# Patient Record
Sex: Female | Born: 1980 | Race: Black or African American | Hispanic: No | Marital: Married
Health system: Midwestern US, Community
[De-identification: ages and names within clinical notes are randomized; demographics above are authoritative.]

## PROBLEM LIST (undated history)

## (undated) ENCOUNTER — Inpatient Hospital Stay (HOSPITAL_COMMUNITY): Payer: Self-pay

## (undated) DIAGNOSIS — Z789 Other specified health status: Secondary | ICD-10-CM

## (undated) DIAGNOSIS — J45909 Unspecified asthma, uncomplicated: Secondary | ICD-10-CM

## (undated) DIAGNOSIS — E119 Type 2 diabetes mellitus without complications: Secondary | ICD-10-CM

## (undated) HISTORY — DX: Type 2 diabetes mellitus without complications: E11.9

## (undated) HISTORY — PX: NO PAST SURGERIES: SHX2092

---

## 2011-08-24 ENCOUNTER — Other Ambulatory Visit (HOSPITAL_COMMUNITY): Payer: Self-pay | Admitting: Family Medicine

## 2011-08-24 ENCOUNTER — Other Ambulatory Visit: Payer: Self-pay | Admitting: Family Medicine

## 2011-08-24 DIAGNOSIS — Z3682 Encounter for antenatal screening for nuchal translucency: Secondary | ICD-10-CM

## 2011-09-01 ENCOUNTER — Other Ambulatory Visit (HOSPITAL_COMMUNITY): Payer: Self-pay | Admitting: Nurse Practitioner

## 2011-09-01 DIAGNOSIS — Z0000 Encounter for general adult medical examination without abnormal findings: Secondary | ICD-10-CM

## 2011-09-03 ENCOUNTER — Encounter (HOSPITAL_COMMUNITY): Payer: Self-pay

## 2011-09-03 ENCOUNTER — Ambulatory Visit (HOSPITAL_COMMUNITY)
Admission: RE | Admit: 2011-09-03 | Discharge: 2011-09-03 | Disposition: A | Discharge status: Home / Self Care | Payer: Medicaid Other | Source: Ambulatory Visit | Attending: Family Medicine | Admitting: Family Medicine

## 2011-09-03 DIAGNOSIS — O351XX0 Maternal care for (suspected) chromosomal abnormality in fetus, not applicable or unspecified: Secondary | ICD-10-CM | POA: Insufficient documentation

## 2011-09-03 DIAGNOSIS — Z3689 Encounter for other specified antenatal screening: Secondary | ICD-10-CM | POA: Insufficient documentation

## 2011-09-03 DIAGNOSIS — Z3682 Encounter for antenatal screening for nuchal translucency: Secondary | ICD-10-CM

## 2011-09-03 DIAGNOSIS — O3510X0 Maternal care for (suspected) chromosomal abnormality in fetus, unspecified, not applicable or unspecified: Secondary | ICD-10-CM | POA: Insufficient documentation

## 2011-09-03 NOTE — Progress Notes (Signed)
Obstetric ultrasound for evaluation of nuchal translucency performed today.  See report in [facility].

## 2011-09-10 ENCOUNTER — Other Ambulatory Visit: Payer: Self-pay

## 2011-10-14 ENCOUNTER — Ambulatory Visit (HOSPITAL_COMMUNITY)
Admission: RE | Admit: 2011-10-14 | Discharge: 2011-10-14 | Disposition: A | Discharge status: Home / Self Care | Payer: Medicaid Other | Source: Ambulatory Visit | Attending: Nurse Practitioner | Admitting: Nurse Practitioner

## 2011-10-14 DIAGNOSIS — Z3682 Encounter for antenatal screening for nuchal translucency: Secondary | ICD-10-CM

## 2011-10-14 DIAGNOSIS — Z363 Encounter for antenatal screening for malformations: Secondary | ICD-10-CM | POA: Insufficient documentation

## 2011-10-14 DIAGNOSIS — O358XX0 Maternal care for other (suspected) fetal abnormality and damage, not applicable or unspecified: Secondary | ICD-10-CM | POA: Insufficient documentation

## 2011-10-14 DIAGNOSIS — Z1389 Encounter for screening for other disorder: Secondary | ICD-10-CM | POA: Insufficient documentation

## 2011-11-09 NOTE — L&D Delivery Note (Signed)
Delivery Note At 4:21 AM a viable female was delivered via Vaginal, Spontaneous Delivery (Presentation: ; Occiput Anterior).  APGAR: 9, 9; weight .   Placenta status: Intact, Spontaneous.  Cord: 3 vessels with the following complications: None.    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 Blood Loss (mL): 300  Mom to postpartum.  Baby to nursery-stable.  Vanessa Marks [name] 03/14/2012, 5:31 AM

## 2012-01-27 ENCOUNTER — Other Ambulatory Visit (HOSPITAL_COMMUNITY): Payer: Self-pay | Admitting: Physician Assistant

## 2012-01-27 ENCOUNTER — Other Ambulatory Visit: Payer: Self-pay | Admitting: Nurse Practitioner

## 2012-01-27 DIAGNOSIS — R319 Hematuria, unspecified: Secondary | ICD-10-CM

## 2012-02-03 ENCOUNTER — Ambulatory Visit (HOSPITAL_COMMUNITY)
Admission: RE | Admit: 2012-02-03 | Discharge: 2012-02-03 | Disposition: A | Discharge status: Home / Self Care | Payer: Medicaid Other | Source: Ambulatory Visit | Attending: Physician Assistant | Admitting: Physician Assistant

## 2012-02-03 DIAGNOSIS — N2889 Other specified disorders of kidney and ureter: Secondary | ICD-10-CM | POA: Insufficient documentation

## 2012-02-03 DIAGNOSIS — O99891 Other specified diseases and conditions complicating pregnancy: Secondary | ICD-10-CM | POA: Insufficient documentation

## 2012-02-03 DIAGNOSIS — N949 Unspecified condition associated with female genital organs and menstrual cycle: Secondary | ICD-10-CM | POA: Insufficient documentation

## 2012-02-03 DIAGNOSIS — R319 Hematuria, unspecified: Secondary | ICD-10-CM | POA: Insufficient documentation

## 2012-02-22 ENCOUNTER — Encounter (HOSPITAL_COMMUNITY): Payer: Self-pay | Admitting: *Deleted

## 2012-02-22 ENCOUNTER — Inpatient Hospital Stay (HOSPITAL_COMMUNITY)
Admission: AD | Admit: 2012-02-22 | Discharge: 2012-02-22 | Disposition: A | Discharge status: Home / Self Care | Payer: Medicaid Other | Source: Ambulatory Visit | Attending: Obstetrics & Gynecology | Admitting: Obstetrics & Gynecology

## 2012-02-22 DIAGNOSIS — O99891 Other specified diseases and conditions complicating pregnancy: Secondary | ICD-10-CM | POA: Insufficient documentation

## 2012-02-22 DIAGNOSIS — R059 Cough, unspecified: Secondary | ICD-10-CM | POA: Insufficient documentation

## 2012-02-22 DIAGNOSIS — R05 Cough: Secondary | ICD-10-CM | POA: Insufficient documentation

## 2012-02-22 DIAGNOSIS — J45909 Unspecified asthma, uncomplicated: Secondary | ICD-10-CM

## 2012-02-22 HISTORY — DX: Other specified health status: Z78.9

## 2012-02-22 MED ORDER — ALBUTEROL SULFATE HFA 108 (90 BASE) MCG/ACT IN AERS: 2.0000 | INHALATION_SPRAY | Freq: Four times a day (QID) | RESPIRATORY_TRACT | Status: DC | PRN

## 2012-02-22 MED ORDER — ALBUTEROL SULFATE (5 MG/ML) 0.5% IN NEBU
2.5000 mg | INHALATION_SOLUTION | Freq: Once | RESPIRATORY_TRACT | Status: AC
  Administered 2012-02-22: 2.5 mg via RESPIRATORY_TRACT

## 2012-02-22 MED ORDER — ALBUTEROL SULFATE (5 MG/ML) 0.5% IN NEBU
INHALATION_SOLUTION | RESPIRATORY_TRACT | Status: AC
  Administered 2012-02-22: 2.5 mg via RESPIRATORY_TRACT
  Filled 2012-02-22: qty 0.5

## 2012-02-22 NOTE — MAU Note (Signed)
D. Poe, CNM at bedside.  Assessment done and poc discussed with pt.  

## 2012-02-22 NOTE — MAU Provider Note (Signed)
Chief Complaint:  Cough and Shortness of Breath   None     HPI  Vanessa Marks is  31 y.o. G2P1001 at [redacted]w[redacted]d presents with 1 hr hx SOB and dry cough. No fever, UR congestion, palpitations or chest pain. No prior episodes or known allergen exposures.  Denies contractions, leakage of fluid or vaginal bleeding. Good fetal movement.   Pregnancy Course: uncomplicated with care at [facility]  Past Medical History: Past Medical History  Diagnosis Date  . No pertinent past medical history     Past Surgical History: Past Surgical History  Procedure Date  . No past surgeries     Family History: History reviewed. No pertinent family history.  Social History: History  Substance Use Topics  . Smoking status: Never Smoker   . Smokeless tobacco: Not on file  . Alcohol Use: No    Allergies: No Known Allergies  Meds:  Prescriptions prior to admission  Medication Sig Dispense Refill  . PRENATAL VITAMINS PO Take by mouth.               Physical Exam  Blood pressure 155/78, pulse 75, temperature 99.6 F (37.6 C), temperature source Oral, resp. rate 22, height 5' 4.5" (1.638 m), weight 80.74 kg (178 lb), last menstrual period 06/11/2011, SpO2 100.00%. GENERAL: Well-developed, well-nourished female in no acute distress.  COR: RRR w/o murmur LUNGS: scattered wheezes with good air movement ABDOMEN: Soft, nontender, nondistended, gravid.  EXTREMITIES: Nontender, no edema, 2+ distal pulses.    FHT:  Baseline 135 , moderate variability, accelerations present, no decelerations Contractions: none  MAU Course: Sx resolved after 1 albuterol nebulizer tx  Assessment: G2 P1 at 36.4 wk Reactive airway dz Cat 1 FHR  Plan: Rx Albuterol inhaler F/U at [facility] in 2 days Avoid allergens         Vanessa Marks 4/16/201312:46 AM

## 2012-02-22 NOTE — MAU Note (Signed)
Bernita Buffy, CNM at bedside assessing pt after breathing treatment.  Pt feeling much better at this time.

## 2012-02-22 NOTE — MAU Note (Signed)
Pt reports a complaint of shortness of breath and cough x 1 hour only. States no history of asthma

## 2012-02-22 NOTE — MAU Note (Signed)
RT finished with breathing treatment at this time.

## 2012-02-29 ENCOUNTER — Other Ambulatory Visit: Payer: Self-pay | Admitting: Family Medicine

## 2012-03-12 ENCOUNTER — Encounter (HOSPITAL_COMMUNITY): Payer: Self-pay | Admitting: *Deleted

## 2012-03-12 ENCOUNTER — Inpatient Hospital Stay (HOSPITAL_COMMUNITY)
Admission: AD | Admit: 2012-03-12 | Discharge: 2012-03-12 | Disposition: A | Discharge status: Home / Self Care | Payer: Medicaid Other | Source: Ambulatory Visit | Attending: Obstetrics & Gynecology | Admitting: Obstetrics & Gynecology

## 2012-03-12 DIAGNOSIS — O479 False labor, unspecified: Secondary | ICD-10-CM

## 2012-03-12 NOTE — MAU Note (Signed)
Pt in for labor eval, reports ucs since 10 am.  Denies any bleeding or lof.   + FM.

## 2012-03-12 NOTE — MAU Provider Note (Signed)
Vanessa Marks is a 31 y.o. female presenting for eval of contractions. Denies leak or bldg, no s/s preeclampsia, no dysuria. History OB History    Grav Para Term Preterm Abortions TAB SAB Ect Mult Living   2 1 1  0 0 0 0 0 0 1     Past Medical History  Diagnosis Date  . No pertinent past medical history    Past Surgical History  Procedure Date  . No past surgeries    Family History: family history is not on file. Social History:  reports that she has never smoked. She does not have any smokeless tobacco history on file. She reports that she does not drink alcohol or use illicit drugs.  ROS  Dilation: 4 Effacement (%): 70 Station: -2 Exam by:: KIM Trevaris Pennella, CNM Blood pressure 135/70, pulse 82, temperature 98.2 F (36.8 C), temperature source Oral, resp. rate 20, height 5\' 4"  (1.626 m), weight 74.39 kg (164 lb), last menstrual period 06/11/2011, SpO2 100.00%. Maternal Exam:  Uterine Assessment: Rare, mild; no pattern  Cervix: Remained essentially unchanged for 3 exams: 4/70/-2/post  Fetal Exam Fetal Monitor Review: Baseline rate: 140.  Variability: moderate (6-25 bpm).   Pattern: accelerations present.   Single variable after one ctx     Physical Exam  Constitutional: She is oriented to person, place, and time. She appears well-developed and well-nourished.  HENT:  Head: Normocephalic.  Cardiovascular: Normal rate.   Respiratory: Effort normal.  Musculoskeletal: Normal range of motion.  Neurological: She is alert and oriented to person, place, and time.  Skin: Skin is warm and dry.  Psychiatric: She has a normal mood and affect. Her behavior is normal. Thought content normal.    Prenatal labs: ABO, Rh:   Antibody:   Rubella:   RPR:    HBsAg:    HIV:    GBS:     Assessment/Plan: IUP at term False labor  D/C home with labor precautions F/U at [facility] as sched or sooner with labor/rom   Daimion Adamcik, [facility] 03/12/2012, 11:05 PM

## 2012-03-12 NOTE — Discharge Instructions (Signed)

## 2012-03-14 ENCOUNTER — Inpatient Hospital Stay (HOSPITAL_COMMUNITY)
Admission: AD | Admit: 2012-03-14 | Discharge: 2012-03-16 | DRG: 775 | Disposition: A | Discharge status: Home / Self Care | Payer: Medicaid Other | Source: Ambulatory Visit | Attending: Obstetrics & Gynecology | Admitting: Obstetrics & Gynecology

## 2012-03-14 ENCOUNTER — Encounter (HOSPITAL_COMMUNITY): Payer: Self-pay | Admitting: *Deleted

## 2012-03-14 DIAGNOSIS — N90810 Female genital mutilation status, unspecified: Secondary | ICD-10-CM | POA: Diagnosis present

## 2012-03-14 DIAGNOSIS — O99892 Other specified diseases and conditions complicating childbirth: Secondary | ICD-10-CM | POA: Diagnosis present

## 2012-03-14 DIAGNOSIS — O9989 Other specified diseases and conditions complicating pregnancy, childbirth and the puerperium: Secondary | ICD-10-CM

## 2012-03-14 DIAGNOSIS — Z22330 Carrier of Group B streptococcus: Secondary | ICD-10-CM

## 2012-03-14 LAB — CBC
Hemoglobin: 12.8 g/dL (ref 12.0–15.0)
MCHC: 32.7 g/dL (ref 30.0–36.0)
Platelets: 151 10*3/uL (ref 150–400)
RBC: 4.77 MIL/uL (ref 3.87–5.11)

## 2012-03-14 LAB — OB RESULTS CONSOLE RUBELLA ANTIBODY, IGM: Rubella: IMMUNE

## 2012-03-14 LAB — OB RESULTS CONSOLE HIV ANTIBODY (ROUTINE TESTING): HIV: NONREACTIVE

## 2012-03-14 LAB — OB RESULTS CONSOLE GC/CHLAMYDIA: Chlamydia: NEGATIVE

## 2012-03-14 LAB — OB RESULTS CONSOLE GBS: GBS: POSITIVE

## 2012-03-14 LAB — OB RESULTS CONSOLE HEPATITIS B SURFACE ANTIGEN: Hepatitis B Surface Ag: NEGATIVE

## 2012-03-14 LAB — OB RESULTS CONSOLE RPR: RPR: NONREACTIVE

## 2012-03-14 LAB — RPR: RPR Ser Ql: NONREACTIVE

## 2012-03-14 MED ORDER — ONDANSETRON HCL 4 MG/2ML IJ SOLN: 4.0000 mg | Freq: Four times a day (QID) | INTRAMUSCULAR | Status: DC | PRN

## 2012-03-14 MED ORDER — OXYCODONE-ACETAMINOPHEN 5-325 MG PO TABS: 1.0000 | ORAL_TABLET | ORAL | Status: DC | PRN

## 2012-03-14 MED ORDER — TETANUS-DIPHTH-ACELL PERTUSSIS 5-2.5-18.5 LF-MCG/0.5 IM SUSP: 0.5000 mL | Freq: Once | INTRAMUSCULAR | Status: DC

## 2012-03-14 MED ORDER — ONDANSETRON HCL 4 MG/2ML IJ SOLN: 4.0000 mg | INTRAMUSCULAR | Status: DC | PRN

## 2012-03-14 MED ORDER — ONDANSETRON HCL 4 MG PO TABS: 4.0000 mg | ORAL_TABLET | ORAL | Status: DC | PRN

## 2012-03-14 MED ORDER — WITCH HAZEL-GLYCERIN EX PADS: 1.0000 "application " | MEDICATED_PAD | CUTANEOUS | Status: DC | PRN

## 2012-03-14 MED ORDER — DIBUCAINE 1 % RE OINT: 1.0000 "application " | TOPICAL_OINTMENT | RECTAL | Status: DC | PRN

## 2012-03-14 MED ORDER — LACTATED RINGERS IV SOLN: 500.0000 mL | INTRAVENOUS | Status: DC | PRN

## 2012-03-14 MED ORDER — CITRIC ACID-SODIUM CITRATE 334-500 MG/5ML PO SOLN: 30.0000 mL | ORAL | Status: DC | PRN

## 2012-03-14 MED ORDER — PRENATAL MULTIVITAMIN CH
1.0000 | ORAL_TABLET | Freq: Every day | ORAL | Status: DC
  Administered 2012-03-14 – 2012-03-16 (×3): 1 via ORAL
  Filled 2012-03-14 (×3): qty 1

## 2012-03-14 MED ORDER — BENZOCAINE-MENTHOL 20-0.5 % EX AERO
1.0000 "application " | INHALATION_SPRAY | CUTANEOUS | Status: DC | PRN
  Filled 2012-03-14: qty 56

## 2012-03-14 MED ORDER — SODIUM CHLORIDE 0.9 % IV SOLN
2.0000 g | Freq: Once | INTRAVENOUS | Status: AC
  Administered 2012-03-14: 2 g via INTRAVENOUS
  Filled 2012-03-14: qty 2000

## 2012-03-14 MED ORDER — CEFAZOLIN SODIUM-DEXTROSE 2-3 GM-% IV SOLR: 2.0000 g | Freq: Once | INTRAVENOUS | Status: DC

## 2012-03-14 MED ORDER — SIMETHICONE 80 MG PO CHEW: 80.0000 mg | CHEWABLE_TABLET | ORAL | Status: DC | PRN

## 2012-03-14 MED ORDER — FLEET ENEMA 7-19 GM/118ML RE ENEM: 1.0000 | ENEMA | RECTAL | Status: DC | PRN

## 2012-03-14 MED ORDER — LANOLIN HYDROUS EX OINT: TOPICAL_OINTMENT | CUTANEOUS | Status: DC | PRN

## 2012-03-14 MED ORDER — ZOLPIDEM TARTRATE 5 MG PO TABS: 5.0000 mg | ORAL_TABLET | Freq: Every evening | ORAL | Status: DC | PRN

## 2012-03-14 MED ORDER — SENNOSIDES-DOCUSATE SODIUM 8.6-50 MG PO TABS
2.0000 | ORAL_TABLET | Freq: Every day | ORAL | Status: DC
  Administered 2012-03-14 – 2012-03-15 (×2): 2 via ORAL

## 2012-03-14 MED ORDER — LACTATED RINGERS IV SOLN: INTRAVENOUS | Status: DC

## 2012-03-14 MED ORDER — DIPHENHYDRAMINE HCL 25 MG PO CAPS: 25.0000 mg | ORAL_CAPSULE | Freq: Four times a day (QID) | ORAL | Status: DC | PRN

## 2012-03-14 MED ORDER — OXYTOCIN BOLUS FROM INFUSION
500.0000 mL | Freq: Once | INTRAVENOUS | Status: DC
  Filled 2012-03-14: qty 1000
  Filled 2012-03-14: qty 500

## 2012-03-14 MED ORDER — ACETAMINOPHEN 325 MG PO TABS: 650.0000 mg | ORAL_TABLET | ORAL | Status: DC | PRN

## 2012-03-14 MED ORDER — IBUPROFEN 600 MG PO TABS: 600.0000 mg | ORAL_TABLET | Freq: Four times a day (QID) | ORAL | Status: DC | PRN

## 2012-03-14 MED ORDER — IBUPROFEN 600 MG PO TABS
600.0000 mg | ORAL_TABLET | Freq: Four times a day (QID) | ORAL | Status: DC
  Administered 2012-03-14 – 2012-03-16 (×8): 600 mg via ORAL
  Filled 2012-03-14 (×8): qty 1

## 2012-03-14 MED ORDER — OXYTOCIN 20 UNITS IN LACTATED RINGERS INFUSION - SIMPLE
125.0000 mL/h | Freq: Once | INTRAVENOUS | Status: AC
  Administered 2012-03-14: 999 mL/h via INTRAVENOUS

## 2012-03-14 MED ORDER — LIDOCAINE HCL (PF) 1 % IJ SOLN
30.0000 mL | INTRAMUSCULAR | Status: DC | PRN
  Administered 2012-03-14: 30 mL via SUBCUTANEOUS
  Filled 2012-03-14: qty 30

## 2012-03-14 NOTE — Progress Notes (Signed)
Notified of pt presenting for labor check.  Notified of VE and ctx pattern.  Will admit to birthing suites.

## 2012-03-14 NOTE — H&P (Signed)
Vanessa Marks is a 31 y.o. female presenting for SOL. Maternal Medical History:  Reason for admission: Reason for admission: contractions.  Contractions: Onset was 3-5 hours ago.    Fetal activity: Perceived fetal activity is normal.     Mother rushed down from MAU at 7cm.  Patient delivered quickly after arrival.  Please see delivery note for details.  OB History    Grav Para Term Preterm Abortions TAB SAB Ect Mult Living   2 1 1  0 0 0 0 0 0 1     Past Medical History  Diagnosis Date  . No pertinent past medical history    Past Surgical History  Procedure Date  . No past surgeries    Family History: family history is not on file. Social History:  reports that she has never smoked. She does not have any smokeless tobacco history on file. She reports that she does not drink alcohol or use illicit drugs.  ROS    Last menstrual period 06/11/2011. Maternal Exam:  Uterine Assessment: Contraction strength is moderate.    Physical Exam  Constitutional: She is oriented to person, place, and time. She appears well-developed and well-nourished.  GI: Soft. Bowel sounds are normal. She exhibits no distension and no mass. There is no tenderness. There is no rebound and no guarding.  Neurological: She is alert and oriented to person, place, and time.  Skin: Skin is warm and dry.  Psychiatric: She has a normal mood and affect. Her behavior is normal. Judgment and thought content normal.    Prenatal labs: ABO, Rh:  A+ Antibody:  negative Rubella:  immune RPR:   non-reactive HBsAg:   negative HIV:   nonreactive GBS:   positive  Assessment/Plan: 1  IUP 39 weeks 4 days 2.  Active labor 3.  GBS positive 4.  Female circumcision  Admit to L&D. Ampicillin was given and completed 2 minutes prior to delivery.  Breast and bottle feeding.   Vanessa Marks [name] 03/14/2012, 4:11 AM

## 2012-03-14 NOTE — MAU Note (Signed)
Pt brought back from lobby for discomfort.  Denies and bleeding.

## 2012-03-14 NOTE — Progress Notes (Signed)
UR chart review completed.  

## 2012-03-15 NOTE — Progress Notes (Signed)
Post Partum Day #1 Subjective: no complaints, up ad lib, voiding, tolerating PO and + flatus Patient is breastfeeding and supplementing with bottle as needed.   Denies contraception.  Pain well tolerated.  Small amount of bloody discharge.     Objective: Blood pressure 144/78, pulse 70, temperature 98 F (36.7 C), temperature source Oral, resp. rate 18, height 5\' 4"  (1.626 m), weight 74.39 kg (164 lb), last menstrual period 06/11/2011, SpO2 97.00%, unknown if currently breastfeeding. 2 previous BPs 133/82, 126/78.   Physical Exam:  General: alert, cooperative and no distress Heart: RRR, normal S1 and S2, no MRG. Lungs:  Clear to auscultation across all lung fields. No wheezing, rales, or rhonchi.     Lochia: appropriate Uterine Fundus: firm, below umbilicus  Lower extremities: 2+ DP pulses, 2+ PT pulses.  No edema.  DVT Evaluation: No evidence of DVT seen on physical exam. No calf tenderness with palpation.    Basename 03/14/12 0405  HGB 12.8  HCT 39.2    Assessment/Plan: Plan for discharge tomorrow   LOS: 1 day   Bedelia Person, PA-S 03/15/2012, 7:42 AM

## 2012-03-15 NOTE — Progress Notes (Signed)
I have seen and examined this patient and I agree with the above. Cam Hai 7:59 AM 03/15/2012

## 2012-03-16 MED ORDER — IBUPROFEN 600 MG PO TABS: 600.0000 mg | ORAL_TABLET | Freq: Four times a day (QID) | ORAL | Status: AC

## 2012-03-16 NOTE — Discharge Summary (Signed)
Obstetric Discharge Summary Reason for Admission: onset of labor Prenatal Procedures: none Intrapartum Procedures: spontaneous vaginal delivery Postpartum Procedures: none Complications-Operative and Postpartum: 1 degree perineal laceration Hemoglobin  Date Value Range Status  03/14/2012 12.8  12.0-15.0 (g/dL) Final     HCT  Date Value Range Status  03/14/2012 39.2  36.0-46.0 (%) Final    Physical Exam:  General: alert, cooperative and no distress Lochia: appropriate Uterine Fundus: firm DVT Evaluation: No evidence of DVT seen on physical exam. Negative Homan's sign. No cords or calf tenderness. No significant calf/ankle edema.  Discharge Diagnoses: Term Pregnancy-delivered  Discharge Information: Date: 03/16/2012 Activity: pelvic rest Diet: routine Medications: PNV and Ibuprofen Condition: stable Instructions: refer to practice specific booklet Discharge to: home Follow-up Information    Follow up with [facility] [place] in 6 weeks.   Contact information:   [address] [employer] [place] [address]          Newborn Data: Live born female  Birth Weight: 6 lb 13.5 oz (3105 g) APGAR: 9, 9  Home with mother.  Marigene Erler [name] 03/16/2012, 11:14 AM

## 2012-03-16 NOTE — Discharge Instructions (Signed)
Vaginal Delivery Care After  Change your pad on each trip to the bathroom.   Wipe gently with toilet paper during your hospital stay. Always wipe from front to back. A spray bottle with warm tap water could also be used or a towelette if available.   Place your soiled pad and toilet paper in a bathroom wastebasket with a plastic bag liner.   During your hospital stay, save any clots. If you pass a clot while on the toilet, do not flush it. Also, if your vaginal flow seems excessive to you, notify nursing personnel.   The first time you get out of bed after delivery, wait for assistance from a nurse. Do not get up alone at any time if you feel weak or dizzy.   Bend and extend your ankles forcefully so that you feel the calves of your legs get hard. Do this 6 times every hour when you are in bed and awake.   Do not sit with one foot under you, dangle your legs over the edge of the bed, or maintain a position that hinders the circulation in your legs.   Many women experience after pains for 2 to 3 days after delivery. These after pains are mild uterine contractions. Ask the nurse for a pain medication if you need something for this. Sometimes breastfeeding stimulates after pains; if you find this to be true, ask for the medication  -  hour before the next feeding.   For you and your infant's protection, do not go beyond the door(s) of the obstetric unit. Do not carry your baby in your arms in the hallway. When taking your baby to and from your room, put your baby in the bassinet and push the bassinet.   Mothers may have their babies in their room as much as they desire.  Document Released: 10/22/2000 Document Revised: 10/14/2011 Document Reviewed: 09/22/2007 Exit[employer] Patient Information 2012 [employer]. 

## 2012-04-26 ENCOUNTER — Other Ambulatory Visit: Payer: Self-pay | Admitting: Family Medicine

## 2012-05-29 ENCOUNTER — Ambulatory Visit (INDEPENDENT_AMBULATORY_CARE_PROVIDER_SITE_OTHER): Payer: Medicaid Other | Admitting: Obstetrics & Gynecology

## 2012-05-29 ENCOUNTER — Encounter: Payer: Self-pay | Admitting: Obstetrics & Gynecology

## 2012-05-29 VITALS — BP 130/86 | HR 78 | Ht 65.0 in | Wt 157.0 lb

## 2012-05-29 DIAGNOSIS — Z01812 Encounter for preprocedural laboratory examination: Secondary | ICD-10-CM

## 2012-05-29 DIAGNOSIS — Z30017 Encounter for initial prescription of implantable subdermal contraceptive: Secondary | ICD-10-CM

## 2012-05-29 NOTE — Progress Notes (Signed)
  Subjective:    Patient ID: Brain Hilts, female    DOB: 06/04/1981, 31 y.o.   MRN: [id number]  HPI  She is here 2 months post partum to have a Nexplanon placed. She has not had sex since the delivery. She understands that irregular bleeding is sometimes a side effect of Nexplanon.   Review of Systems Pap normal 10/12    Objective:   Physical Exam  UPT negative, consent signed, time out done Her left arm was prepped with betadine and infiltrated with 3 cc of 1% lidocaine. A Nexplanon was easily placed about 8 cm up from her elbow. She tolerated the procedure well. A small steristrip was placed and her arm was bandaged.      Assessment & Plan:  Contraception- Nexplanon I have recommended condoms for a month. RTC 11/13 for annual/prn sooner.

## 2012-05-29 NOTE — Progress Notes (Signed)
Patient is here as a referral from the health department for nexplanon.

## 2014-09-09 ENCOUNTER — Encounter: Payer: Self-pay | Admitting: Obstetrics & Gynecology

## 2016-05-25 ENCOUNTER — Encounter (HOSPITAL_COMMUNITY): Payer: Self-pay | Admitting: *Deleted

## 2016-05-25 ENCOUNTER — Inpatient Hospital Stay (HOSPITAL_COMMUNITY): Payer: Medicaid Other

## 2016-05-25 ENCOUNTER — Inpatient Hospital Stay (HOSPITAL_COMMUNITY)
Admission: AD | Admit: 2016-05-25 | Discharge: 2016-05-26 | Disposition: A | Discharge status: Home / Self Care | Payer: Medicaid Other | Source: Ambulatory Visit | Attending: Obstetrics & Gynecology | Admitting: Obstetrics & Gynecology

## 2016-05-25 DIAGNOSIS — M545 Low back pain: Secondary | ICD-10-CM | POA: Diagnosis not present

## 2016-05-25 DIAGNOSIS — Z79899 Other long term (current) drug therapy: Secondary | ICD-10-CM | POA: Diagnosis not present

## 2016-05-25 DIAGNOSIS — O209 Hemorrhage in early pregnancy, unspecified: Secondary | ICD-10-CM | POA: Diagnosis present

## 2016-05-25 DIAGNOSIS — O3680X0 Pregnancy with inconclusive fetal viability, not applicable or unspecified: Secondary | ICD-10-CM

## 2016-05-25 LAB — URINALYSIS, ROUTINE W REFLEX MICROSCOPIC
Bilirubin Urine: NEGATIVE
GLUCOSE, UA: NEGATIVE mg/dL
Ketones, ur: NEGATIVE mg/dL
NITRITE: NEGATIVE
PH: 7 (ref 5.0–8.0)
PROTEIN: NEGATIVE mg/dL
Specific Gravity, Urine: 1.015 (ref 1.005–1.030)

## 2016-05-25 LAB — URINE MICROSCOPIC-ADD ON

## 2016-05-25 LAB — WET PREP, GENITAL
CLUE CELLS WET PREP: NONE SEEN
SPERM: NONE SEEN
TRICH WET PREP: NONE SEEN
YEAST WET PREP: NONE SEEN

## 2016-05-25 LAB — CBC WITH DIFFERENTIAL/PLATELET
BASOS PCT: 0 %
Basophils Absolute: 0 10*3/uL (ref 0.0–0.1)
Eosinophils Absolute: 0.3 10*3/uL (ref 0.0–0.7)
Eosinophils Relative: 4 %
HEMATOCRIT: 33.3 % — AB (ref 36.0–46.0)
HEMOGLOBIN: 10.9 g/dL — AB (ref 12.0–15.0)
Lymphocytes Relative: 40 %
Lymphs Abs: 3.1 10*3/uL (ref 0.7–4.0)
MCH: 26.3 pg (ref 26.0–34.0)
MCHC: 32.7 g/dL (ref 30.0–36.0)
MCV: 80.2 fL (ref 78.0–100.0)
MONOS PCT: 4 %
Monocytes Absolute: 0.3 10*3/uL (ref 0.1–1.0)
NEUTROS ABS: 4.2 10*3/uL (ref 1.7–7.7)
NEUTROS PCT: 52 %
Platelets: 254 10*3/uL (ref 150–400)
RBC: 4.15 MIL/uL (ref 3.87–5.11)
RDW: 13.8 % (ref 11.5–15.5)
WBC: 7.9 10*3/uL (ref 4.0–10.5)

## 2016-05-25 LAB — POCT PREGNANCY, URINE: Preg Test, Ur: POSITIVE — AB

## 2016-05-25 NOTE — MAU Provider Note (Signed)
History     CSN: [id number]  Arrival date and time: 05/25/16 2220   First Provider Initiated Contact with Patient 05/25/16 2245      Chief Complaint  Patient presents with  . Possible Pregnancy  . Vaginal Bleeding   HPI Vanessa Marks is a 35 y.o. G3P2002 at [redacted]w[redacted]d who presents to MAU today with complaint of spotting with wiping since earlier tonight. The patient states +HPT and LMP of 04/16/16 although she states that LMP was not normal for her, it was late and had "white stuff" in the blood. She denies abdominal pain, but endorses mild intermittent low back pain. She denies vaginal discharge, UTI symptoms, fever, N/V/D or recent intercourse. She has not yet been seen for this pregnancy.   OB History    Gravida Para Term Preterm AB TAB SAB Ectopic Multiple Living   0 0 0 0 0 0 2      Past Medical History  Diagnosis Date  . No pertinent past medical history     Past Surgical History  Procedure Laterality Date  . No past surgeries      History reviewed. No pertinent family history.  Social History  Substance Use Topics  . Smoking status: Never Smoker   . Smokeless tobacco: None  . Alcohol Use: No    Allergies: No Known Allergies  Prescriptions prior to admission  Medication Sig Dispense Refill Last Dose  . Prenatal Vit-Fe Fumarate-FA (PRENATAL MULTIVITAMIN) TABS Take 1 tablet by mouth daily.   05/25/2016 at Unknown time  . albuterol (PROVENTIL HFA;VENTOLIN HFA) 108 (90 BASE) MCG/ACT inhaler Inhale 2 puffs into the lungs every 6 (six) hours as needed for wheezing. 1 Inhaler 2 Taking  . ibuprofen (ADVIL,MOTRIN) 600 MG tablet    Taking  . Prenatal Vit-DSS-Fe Fum-FA (SE-NATAL 19) 29-1 MG TABS TAKE 1 TABLET BY MOUTH DAILY 32 tablet 0 Taking    Review of Systems  Constitutional: Negative for fever and malaise/fatigue.  Gastrointestinal: Negative for nausea, vomiting, abdominal pain, diarrhea and constipation.  Genitourinary: Negative for dysuria, urgency and  frequency.       + spotting Neg - vaginal discharge   Physical Exam   Blood pressure 150/81, pulse 90, temperature 98.3 F (36.8 C), temperature source Oral, resp. rate 18, height  (1.702 m), weight 176 lb (79.833 kg), last menstrual period 04/16/2016, SpO2 100 %, currently breastfeeding.  Physical Exam  Nursing note and vitals reviewed. Constitutional: She is oriented to person, place, and time. She appears well-developed and well-nourished. No distress.  HENT:  Head: Normocephalic and atraumatic.  Cardiovascular: Normal rate.   Respiratory: Effort normal.  GI: Soft. She exhibits no distension and no mass. There is no tenderness. There is no rebound and no guarding.  Genitourinary: Uterus is not enlarged and not tender. Cervix exhibits friability (significant friability noted). Cervix exhibits no motion tenderness and no discharge. Right adnexum displays no mass and no tenderness. Left adnexum displays no mass and no tenderness. There is bleeding (scant ) in the vagina. No vaginal discharge found.  Neurological: She is alert and oriented to person, place, and time.  Skin: Skin is warm and dry. No erythema.  Psychiatric: She has a normal mood and affect.   Results for orders placed or performed during the hospital encounter of 05/25/16 (from the past 24 hour(s))  Urinalysis, Routine w reflex microscopic (not at [facility])     Status: Abnormal   Collection Time: 05/25/16 10:30 PM  Result Value Ref  Range   Color, Urine YELLOW YELLOW   APPearance CLEAR CLEAR   Specific Gravity, Urine 1.015 1.005 - 1.030   pH 7.0 5.0 - 8.0   Glucose, UA NEGATIVE NEGATIVE mg/dL   Hgb urine dipstick MODERATE (A) NEGATIVE   Bilirubin Urine NEGATIVE NEGATIVE   Ketones, ur NEGATIVE NEGATIVE mg/dL   Protein, ur NEGATIVE NEGATIVE mg/dL   Nitrite NEGATIVE NEGATIVE   Leukocytes, UA TRACE (A) NEGATIVE  Urine microscopic-add on     Status: Abnormal   Collection Time: 05/25/16 10:30 PM  Result Value Ref Range    Squamous Epithelial / LPF 0-5 (A) NONE SEEN   WBC, UA 0-5 0 - 5 WBC/hpf   RBC / HPF 0-5 0 - 5 RBC/hpf   Bacteria, UA RARE (A) NONE SEEN  Pregnancy, urine POC     Status: Abnormal   Collection Time: 05/25/16 10:53 PM  Result Value Ref Range   Preg Test, Ur POSITIVE (A) NEGATIVE  Wet prep, genital     Status: Abnormal   Collection Time: 05/25/16 11:01 PM  Result Value Ref Range   Yeast Wet Prep HPF POC NONE SEEN NONE SEEN   Trich, Wet Prep NONE SEEN NONE SEEN   Clue Cells Wet Prep HPF POC NONE SEEN NONE SEEN   WBC, Wet Prep HPF POC FEW (A) NONE SEEN   Sperm NONE SEEN   CBC with Differential/Platelet     Status: Abnormal   Collection Time: 05/25/16 11:13 PM  Result Value Ref Range   WBC 7.9 4.0 - 10.5 K/uL   RBC 4.15 3.87 - 5.11 MIL/uL   Hemoglobin 10.9 (L) 12.0 - 15.0 g/dL   HCT 16.133.3 (L) 09.636.0 - 04.546.0 %   MCV 80.2 78.0 - 100.0 fL   MCH 26.3 26.0 - 34.0 pg   MCHC 32.7 30.0 - 36.0 g/dL   RDW 40.913.8 81.111.5 - 91.415.5 %   Platelets 254 150 - 400 K/uL   Neutrophils Relative % 52 %   Neutro Abs 4.2 1.7 - 7.7 K/uL   Lymphocytes Relative 40 %   Lymphs Abs 3.1 0.7 - 4.0 K/uL   Monocytes Relative 4 %   Monocytes Absolute 0.3 0.1 - 1.0 K/uL   Eosinophils Relative 4 %   Eosinophils Absolute 0.3 0.0 - 0.7 K/uL   Basophils Relative 0 %   Basophils Absolute 0.0 0.0 - 0.1 K/uL  hCG, quantitative, pregnancy     Status: Abnormal   Collection Time: 05/25/16 11:13 PM  Result Value Ref Range   hCG, Beta Chain, Quant, S 403 (H) <5 mIU/mL   [place] Ob Comp Less 14 Wks  05/25/2016  CLINICAL DATA:  Vaginal bleeding and first-trimester pregnancy. Pending quantitative beta HCG EXAM: OBSTETRIC <14 WK US AND TRANSVAGINAL OB US TECHNIQUE: Both transabdominal and transvaginal ultrasound examinations were performed for complete evaluation of the gestation as well as the maternal uterus, adnexal regions, and pelvic cul-de-sac. Transvaginal technique was performed to assess early pregnancy. COMPARISON:  None. FINDINGS:  No intra or extrauterine gestational sac is identified. No adnexal mass or pelvic fluid. Symmetric normal appearance of the ovaries. IMPRESSION: Pregnancy of unknown location with normal pelvic ultrasound. Beta HCG is currently pending. Differential considerations include intrauterine gestation too early to be sonographically visualized, spontaneous abortion, or ectopic pregnancy. Consider follow-up ultrasound in 10 days and serial quantitative beta HCG follow-up. Electronically Signed   By: Marnee SpringJonathon  Watts M.D.   On: 05/25/2016 23:56   [place] Ob Transvaginal  05/25/2016  CLINICAL DATA:  Vaginal  bleeding and first-trimester pregnancy. Pending quantitative beta HCG EXAM: OBSTETRIC <14 WK [place] AND TRANSVAGINAL OB US TECHNIQUE: Both transabdominal and transvaginal ultrasound examinations were performed for complete evaluation of the gestation as well as the maternal uterus, adnexal regions, and pelvic cul-de-sac. Transvaginal technique was performed to assess early pregnancy. COMPARISON:  None. FINDINGS: No intra or extrauterine gestational sac is identified. No adnexal mass or pelvic fluid. Symmetric normal appearance of the ovaries. IMPRESSION: Pregnancy of unknown location with normal pelvic ultrasound. Beta HCG is currently pending. Differential considerations include intrauterine gestation too early to be sonographically visualized, spontaneous abortion, or ectopic pregnancy. Consider follow-up ultrasound in 10 days and serial quantitative beta HCG follow-up. Electronically Signed   By: Marnee Spring M.D.   On: 05/25/2016 23:56    MAU Course  Procedures None  MDM +UPT UA, wet prep, GC/chlamydia, CBC, quant hCG, HIV, RPR and [place] today to rule out ectopic pregnancy A+ blood type from previous visit in Epic  Assessment and Plan  A: Pregnancy of unknown location Vaginal bleeding in pregnancy  P: Discharge home Ectopic/bleeding precautions discussed Patient advised to follow-up with [facility] on Friday at  8:00 am for repeat labs  Patient may return to MAU as needed or if her condition were to change or worsen   Marny Lowenstein, PA-C  05/26/2016, 12:13 AM

## 2016-05-25 NOTE — MAU Note (Signed)
Pt c/o vaginal bleeding that started tonight around 830 pm when wiping. Some mild lower back cramping-rates 3/10. LMP: 04/16/2016.

## 2016-05-26 DIAGNOSIS — O209 Hemorrhage in early pregnancy, unspecified: Secondary | ICD-10-CM

## 2016-05-26 LAB — GC/CHLAMYDIA PROBE AMP (~~LOC~~) NOT AT ARMC
Chlamydia: NEGATIVE
NEISSERIA GONORRHEA: NEGATIVE

## 2016-05-26 LAB — HCG, QUANTITATIVE, PREGNANCY: hCG, Beta Chain, Quant, S: 403 m[IU]/mL — ABNORMAL HIGH (ref <5)

## 2016-05-26 NOTE — Discharge Instructions (Signed)

## 2016-05-27 LAB — RPR: RPR Ser Ql: NONREACTIVE

## 2016-05-27 LAB — HIV ANTIBODY (ROUTINE TESTING W REFLEX): HIV Screen 4th Generation wRfx: NONREACTIVE

## 2016-05-28 ENCOUNTER — Encounter (HOSPITAL_COMMUNITY): Payer: Self-pay

## 2016-05-28 ENCOUNTER — Other Ambulatory Visit: Payer: Medicaid Other

## 2016-05-28 ENCOUNTER — Inpatient Hospital Stay (HOSPITAL_COMMUNITY)
Admission: AD | Admit: 2016-05-28 | Discharge: 2016-05-28 | Disposition: A | Discharge status: Home / Self Care | Payer: Medicaid Other | Source: Ambulatory Visit | Attending: Family Medicine | Admitting: Family Medicine

## 2016-05-28 DIAGNOSIS — O0281 Inappropriate change in quantitative human chorionic gonadotropin (hCG) in early pregnancy: Secondary | ICD-10-CM

## 2016-05-28 DIAGNOSIS — Z3A00 Weeks of gestation of pregnancy not specified: Secondary | ICD-10-CM | POA: Diagnosis not present

## 2016-05-28 DIAGNOSIS — J45909 Unspecified asthma, uncomplicated: Secondary | ICD-10-CM | POA: Diagnosis not present

## 2016-05-28 DIAGNOSIS — O209 Hemorrhage in early pregnancy, unspecified: Secondary | ICD-10-CM | POA: Insufficient documentation

## 2016-05-28 DIAGNOSIS — O99519 Diseases of the respiratory system complicating pregnancy, unspecified trimester: Secondary | ICD-10-CM | POA: Insufficient documentation

## 2016-05-28 DIAGNOSIS — R109 Unspecified abdominal pain: Secondary | ICD-10-CM | POA: Diagnosis present

## 2016-05-28 DIAGNOSIS — N939 Abnormal uterine and vaginal bleeding, unspecified: Secondary | ICD-10-CM | POA: Diagnosis present

## 2016-05-28 HISTORY — DX: Unspecified asthma, uncomplicated: J45.909

## 2016-05-28 LAB — CBC
HCT: 33 % — ABNORMAL LOW (ref 36.0–46.0)
Hemoglobin: 10.6 g/dL — ABNORMAL LOW (ref 12.0–15.0)
MCH: 26.1 pg (ref 26.0–34.0)
MCHC: 32.1 g/dL (ref 30.0–36.0)
MCV: 81.3 fL (ref 78.0–100.0)
PLATELETS: 240 10*3/uL (ref 150–400)
RBC: 4.06 MIL/uL (ref 3.87–5.11)
RDW: 13.9 % (ref 11.5–15.5)
WBC: 7.4 10*3/uL (ref 4.0–10.5)

## 2016-05-28 LAB — HCG, QUANTITATIVE, PREGNANCY: hCG, Beta Chain, Quant, S: 436 m[IU]/mL — ABNORMAL HIGH (ref <5)

## 2016-05-28 NOTE — MAU Provider Note (Signed)
History     CSN: [id number]  Arrival date and time: 05/28/16 0458   First Provider Initiated Contact with Patient 05/28/16 [phone]      Chief Complaint  Patient presents with  . Vaginal Bleeding   Vaginal Bleeding The patient's primary symptoms include pelvic pain and vaginal discharge. This is a new problem. Episode onset: 2 days ago, but this morning at 0400 the bleeding increased.  The problem occurs constantly. The problem has been gradually worsening. Pain severity now: 6/10  The problem affects both sides. She is pregnant. Associated symptoms include abdominal pain. Pertinent negatives include no chills, constipation, diarrhea, dysuria, fever, frequency, nausea, urgency or vomiting. The vaginal discharge was bloody. The vaginal bleeding is typical of menses. She has been passing clots. She has not been passing tissue. Nothing aggravates the symptoms. She has tried nothing for the symptoms. Menstrual history: LMP 04/16/16     Past Medical History  Diagnosis Date  . No pertinent past medical history   . Asthma     Past Surgical History  Procedure Laterality Date  . No past surgeries      No family history on file.  Social History  Substance Use Topics  . Smoking status: Never Smoker   . Smokeless tobacco: None  . Alcohol Use: No    Allergies: No Known Allergies  Prescriptions prior to admission  Medication Sig Dispense Refill Last Dose  . albuterol (PROVENTIL) (2.5 MG/3ML) 0.083% nebulizer solution Take 2.5 mg by nebulization every 6 (six) hours as needed for wheezing or shortness of breath.     . Prenatal Vit-Fe Fumarate-FA (PRENATAL MULTIVITAMIN) TABS Take 1 tablet by mouth daily.   05/27/2016 at Unknown time    Review of Systems  Constitutional: Negative for fever and chills.  Gastrointestinal: Positive for abdominal pain. Negative for nausea, vomiting, diarrhea and constipation.  Genitourinary: Positive for vaginal bleeding, vaginal discharge and pelvic pain.  Negative for dysuria, urgency and frequency.   Physical Exam   Blood pressure 131/70, pulse 73, temperature 98.5 F (36.9 C), temperature source Oral, resp. rate 16, last menstrual period 04/16/2016, SpO2 100 %, currently breastfeeding.  Physical Exam  Nursing note and vitals reviewed. Constitutional: She is oriented to person, place, and time. She appears well-developed and well-nourished. No distress.  HENT:  Head: Normocephalic.  Cardiovascular: Normal rate.   Respiratory: Effort normal.  GI: Soft. There is no tenderness. There is no rebound.  Neurological: She is alert and oriented to person, place, and time.  Skin: Skin is warm and dry.  Psychiatric: She has a normal mood and affect.   Results for Vanessa HiltsLMAHADI, Vanessa (MRN [id number]) as of 05/28/2016 06:45  Ref. Range 05/25/2016 23:13 05/25/2016 23:53 05/28/2016 05:35  HCG, Beta Chain, Quant, S Latest Ref Range: <5 mIU/mL 403 (H)  436 (H)   Results for orders placed or performed during the hospital encounter of 05/28/16 (from the past 24 hour(s))  CBC     Status: Abnormal   Collection Time: 05/28/16  5:35 AM  Result Value Ref Range   WBC 7.4 4.0 - 10.5 K/uL   RBC 4.06 3.87 - 5.11 MIL/uL   Hemoglobin 10.6 (L) 12.0 - 15.0 g/dL   HCT 56.233.0 (L) 13.036.0 - 86.546.0 %   MCV 81.3 78.0 - 100.0 fL   MCH 26.1 26.0 - 34.0 pg   MCHC 32.1 30.0 - 36.0 g/dL   RDW 78.413.9 69.611.5 - 29.515.5 %   Platelets 240 150 - 400 K/uL  hCG, quantitative, pregnancy  Status: Abnormal   Collection Time: 05/28/16  5:35 AM  Result Value Ref Range   hCG, Beta Chain, Quant, S 436 (H) <5 mIU/mL    MAU Course  Procedures  MDM 1610: Phone interpreter used to review results with the patient. She speaks enough english to give her symptoms, but not to fully understand what is going on. Reviewed that HCG did not change much. This is either a miscarriage or ectopic pregnancy. Offered the choice for MTX or to return in 48 hours. After reviewing all the R/B she would like to come back  in 48 hours.   Assessment and Plan   1. Inappropriate change in quantitative hCG in early pregnancy   2. Vaginal bleeding in pregnancy, first trimester    DC home Comfort measures reviewed  1st Trimester precautions  Bleeding precautions Ectopic precautions RX: none  Return to MAU as needed FU with OB as planned  Follow-up Information    Follow up with [facility] [place] [facility].   Why:  SUNDAY 05/30/16 IN THE MORNING FOR BLOOD WORK    Contact information:   [address] [id number] mc [place] [address] [phone]        Tawnya Crook 05/28/2016, 6:43 AM

## 2016-05-28 NOTE — Discharge Instructions (Signed)

## 2016-05-28 NOTE — MAU Note (Signed)
Pt c/o heavy vaginal bleeding that started around 0400-states that she has changed her pad x2. Is having some lower back pain/cramping that she rates 6/10. Did not take anything. Has a clinic appt today at 0800 but bleeding is much heavier than when she was here a couple days ago.

## 2016-05-30 ENCOUNTER — Inpatient Hospital Stay (HOSPITAL_COMMUNITY)
Admission: AD | Admit: 2016-05-30 | Discharge: 2016-05-30 | Disposition: A | Discharge status: Home / Self Care | Payer: Medicaid Other | Source: Ambulatory Visit | Attending: Obstetrics and Gynecology | Admitting: Obstetrics and Gynecology

## 2016-05-30 DIAGNOSIS — O3680X0 Pregnancy with inconclusive fetal viability, not applicable or unspecified: Secondary | ICD-10-CM

## 2016-05-30 DIAGNOSIS — Z3A01 Less than 8 weeks gestation of pregnancy: Secondary | ICD-10-CM | POA: Diagnosis not present

## 2016-05-30 DIAGNOSIS — O209 Hemorrhage in early pregnancy, unspecified: Secondary | ICD-10-CM | POA: Insufficient documentation

## 2016-05-30 LAB — HCG, QUANTITATIVE, PREGNANCY: HCG, BETA CHAIN, QUANT, S: 78 m[IU]/mL — AB (ref <5)

## 2016-05-30 NOTE — MAU Note (Signed)
Here for repeat blood work, small amount of bleeding no pain

## 2016-05-30 NOTE — MAU Provider Note (Signed)
History   Chief Complaint:  Vaginal Bleeding   Vanessa Marks is  35 y.o. [id number] Patient's last menstrual period was 04/16/2016.[name] Patient is here for follow up of quantitative HCG and ongoing surveillance of pregnancy status.   She is [redacted]w[redacted]d weeks gestation  By LMP   Since her last visit, the patient is without new complaint.   The patient reports bleeding as  Increased.   General ROS: No abdominal pain, increased vaginal bleeding, denies having any other problems. Her previous Quantitative HCG values are:   05-25-16  403  05-28-16  436  Physical Exam   Blood pressure 133/58, pulse 70, temperature 98.3 F (36.8 C), temperature source Oral, resp. rate 18, last menstrual period 04/16/2016, currently breastfeeding.  Focused Exam: GENERAL: Well-developed, well-nourished female in no acute distress.  HEENT: Normocephalic, atraumatic.  LUNGS: Effort normal  HEART: Regular rate  SKIN: Warm, dry and without erythema  PSYCH: Normal mood and affect   Labs: Results for orders placed or performed during the hospital encounter of 05/30/16 (from the past 24 hour(s))  hCG, quantitative, pregnancy   Collection Time: 05/30/16  9:06 AM  Result Value Ref Range   hCG, Beta Chain, Quant, S 78 (H) <5 mIU/mL    Ultrasound Studies:   US Ob Comp Less 14 Wks  Result Date: 05/25/2016 CLINICAL DATA:  Vaginal bleeding and first-trimester pregnancy. Pending quantitative beta HCG EXAM: OBSTETRIC <14 WK [place] AND TRANSVAGINAL OB US TECHNIQUE: Both transabdominal and transvaginal ultrasound examinations were performed for complete evaluation of the gestation as well as the maternal uterus, adnexal regions, and pelvic cul-de-sac. Transvaginal technique was performed to assess early pregnancy. COMPARISON:  None. FINDINGS: No intra or extrauterine gestational sac is identified. No adnexal mass or pelvic fluid. Symmetric normal appearance of the ovaries. IMPRESSION: Pregnancy of unknown location with normal pelvic  ultrasound. Beta HCG is currently pending. Differential considerations include intrauterine gestation too early to be sonographically visualized, spontaneous abortion, or ectopic pregnancy. Consider follow-up ultrasound in 10 days and serial quantitative beta HCG follow-up. Electronically Signed   By: Marnee Spring M.D.   On: 05/25/2016 23:56   US Ob Transvaginal  Result Date: 05/25/2016 CLINICAL DATA:  Vaginal bleeding and first-trimester pregnancy. Pending quantitative beta HCG EXAM: OBSTETRIC <14 WK [place] AND TRANSVAGINAL OB US TECHNIQUE: Both transabdominal and transvaginal ultrasound examinations were performed for complete evaluation of the gestation as well as the maternal uterus, adnexal regions, and pelvic cul-de-sac. Transvaginal technique was performed to assess early pregnancy. COMPARISON:  None. FINDINGS: No intra or extrauterine gestational sac is identified. No adnexal mass or pelvic fluid. Symmetric normal appearance of the ovaries. IMPRESSION: Pregnancy of unknown location with normal pelvic ultrasound. Beta HCG is currently pending. Differential considerations include intrauterine gestation too early to be sonographically visualized, spontaneous abortion, or ectopic pregnancy. Consider follow-up ultrasound in 10 days and serial quantitative beta HCG follow-up. Electronically Signed   By: Marnee Spring M.D.   On: 05/25/2016 23:56    Assessment: Pregnancy of unknown location with falling quants - Miscarriage  Plan: The patient is instructed to follow up on Monday July 31 at 11 am.  Need to follow quants to zero. Client has no other questions.  Due to the vaginal bleeding she assumed she was having a miscarriage. Advised to return sooner with severe vaginal bleeding or abdominal pain.   Vanessa Marks 05/30/2016, 10:18 AM

## 2016-06-07 ENCOUNTER — Other Ambulatory Visit: Payer: Self-pay

## 2016-06-07 DIAGNOSIS — O039 Complete or unspecified spontaneous abortion without complication: Secondary | ICD-10-CM

## 2016-06-07 DIAGNOSIS — O3680X0 Pregnancy with inconclusive fetal viability, not applicable or unspecified: Secondary | ICD-10-CM

## 2016-06-07 NOTE — Progress Notes (Unsigned)
Patient arrived to office today for stat bhcg, per Dr Debroah Loop does not need to be stat.

## 2016-06-08 LAB — HCG, QUANTITATIVE, PREGNANCY

## 2016-06-14 ENCOUNTER — Telehealth: Payer: Self-pay | Admitting: General Practice

## 2016-06-14 NOTE — Telephone Encounter (Signed)
Called patient with pacific interpreter [phone] & informed her of normal bhcg level. Patient verbalized understanding to all & had no questions

## 2016-09-11 ENCOUNTER — Emergency Department (HOSPITAL_COMMUNITY)
Admission: EM | Admit: 2016-09-11 | Discharge: 2016-09-11 | Disposition: A | Discharge status: Home / Self Care | Payer: Medicaid Other | Attending: Emergency Medicine | Admitting: Emergency Medicine

## 2016-09-11 ENCOUNTER — Emergency Department (HOSPITAL_COMMUNITY): Payer: Medicaid Other

## 2016-09-11 ENCOUNTER — Encounter (HOSPITAL_COMMUNITY): Payer: Self-pay | Admitting: Emergency Medicine

## 2016-09-11 DIAGNOSIS — Y92410 Unspecified street and highway as the place of occurrence of the external cause: Secondary | ICD-10-CM | POA: Insufficient documentation

## 2016-09-11 DIAGNOSIS — Z3A11 11 weeks gestation of pregnancy: Secondary | ICD-10-CM | POA: Insufficient documentation

## 2016-09-11 DIAGNOSIS — O2341 Unspecified infection of urinary tract in pregnancy, first trimester: Secondary | ICD-10-CM | POA: Diagnosis not present

## 2016-09-11 DIAGNOSIS — J45909 Unspecified asthma, uncomplicated: Secondary | ICD-10-CM | POA: Insufficient documentation

## 2016-09-11 DIAGNOSIS — O9989 Other specified diseases and conditions complicating pregnancy, childbirth and the puerperium: Secondary | ICD-10-CM | POA: Diagnosis present

## 2016-09-11 DIAGNOSIS — O26899 Other specified pregnancy related conditions, unspecified trimester: Secondary | ICD-10-CM

## 2016-09-11 DIAGNOSIS — R0789 Other chest pain: Secondary | ICD-10-CM | POA: Insufficient documentation

## 2016-09-11 DIAGNOSIS — O0281 Inappropriate change in quantitative human chorionic gonadotropin (hCG) in early pregnancy: Secondary | ICD-10-CM | POA: Insufficient documentation

## 2016-09-11 DIAGNOSIS — N939 Abnormal uterine and vaginal bleeding, unspecified: Secondary | ICD-10-CM

## 2016-09-11 DIAGNOSIS — Y939 Activity, unspecified: Secondary | ICD-10-CM | POA: Insufficient documentation

## 2016-09-11 DIAGNOSIS — Y999 Unspecified external cause status: Secondary | ICD-10-CM | POA: Insufficient documentation

## 2016-09-11 DIAGNOSIS — O208 Other hemorrhage in early pregnancy: Secondary | ICD-10-CM | POA: Insufficient documentation

## 2016-09-11 DIAGNOSIS — R109 Unspecified abdominal pain: Secondary | ICD-10-CM

## 2016-09-11 LAB — CBC WITH DIFFERENTIAL/PLATELET
BASOS ABS: 0 10*3/uL (ref 0.0–0.1)
BASOS PCT: 0 %
EOS ABS: 0.3 10*3/uL (ref 0.0–0.7)
Eosinophils Relative: 3 %
HEMATOCRIT: 34.3 % — AB (ref 36.0–46.0)
HEMOGLOBIN: 11.4 g/dL — AB (ref 12.0–15.0)
Lymphocytes Relative: 23 %
Lymphs Abs: 1.9 10*3/uL (ref 0.7–4.0)
MCH: 26.7 pg (ref 26.0–34.0)
MCHC: 33.2 g/dL (ref 30.0–36.0)
MCV: 80.3 fL (ref 78.0–100.0)
MONOS PCT: 6 %
Monocytes Absolute: 0.5 10*3/uL (ref 0.1–1.0)
NEUTROS ABS: 5.6 10*3/uL (ref 1.7–7.7)
NEUTROS PCT: 68 %
Platelets: 216 10*3/uL (ref 150–400)
RBC: 4.27 MIL/uL (ref 3.87–5.11)
RDW: 13.1 % (ref 11.5–15.5)
WBC: 8.3 10*3/uL (ref 4.0–10.5)

## 2016-09-11 LAB — URINALYSIS, ROUTINE W REFLEX MICROSCOPIC
Bilirubin Urine: NEGATIVE
GLUCOSE, UA: NEGATIVE mg/dL
Hgb urine dipstick: NEGATIVE
KETONES UR: NEGATIVE mg/dL
Nitrite: NEGATIVE
PROTEIN: NEGATIVE mg/dL
Specific Gravity, Urine: 1.013 (ref 1.005–1.030)
pH: 7 (ref 5.0–8.0)

## 2016-09-11 LAB — WET PREP, GENITAL
CLUE CELLS WET PREP: NONE SEEN
Sperm: NONE SEEN
Trich, Wet Prep: NONE SEEN
YEAST WET PREP: NONE SEEN

## 2016-09-11 LAB — BASIC METABOLIC PANEL
ANION GAP: 8 (ref 5–15)
BUN: 8 mg/dL (ref 6–20)
CALCIUM: 9.1 mg/dL (ref 8.9–10.3)
CO2: 23 mmol/L (ref 22–32)
CREATININE: 0.49 mg/dL (ref 0.44–1.00)
Chloride: 104 mmol/L (ref 101–111)
Glucose, Bld: 123 mg/dL — ABNORMAL HIGH (ref 65–99)
Potassium: 3.5 mmol/L (ref 3.5–5.1)
SODIUM: 135 mmol/L (ref 135–145)

## 2016-09-11 LAB — URINE MICROSCOPIC-ADD ON

## 2016-09-11 LAB — HCG, QUANTITATIVE, PREGNANCY: HCG, BETA CHAIN, QUANT, S: 174066 m[IU]/mL — AB (ref <5)

## 2016-09-11 MED ORDER — VITAMIN B-6 25 MG PO TABS: 25.0000 mg | ORAL_TABLET | Freq: Every day | ORAL | 0 refills | Status: DC

## 2016-09-11 MED ORDER — CEFPODOXIME PROXETIL 100 MG PO TABS: 100.0000 mg | ORAL_TABLET | Freq: Two times a day (BID) | ORAL | 0 refills | Status: DC

## 2016-09-11 NOTE — ED Notes (Signed)
Pt at ultrasound

## 2016-09-11 NOTE — Discharge Instructions (Signed)
You had a urinary tract infection today. Please make sure that you take your antibiotics as directed until completion and let your OBGYN know about this diagnosis. Also let them know that you had an ultrasound today that was very reassuring estimating you are 11 weeks and 2 days pregnant. It is very important to keep your scheduled appointment with the women's physician. Please return to ER for new or worsening symptoms, any additional concerns.
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
## 2016-09-13 LAB — ABO/RH: ABO/RH(D): A POS

## 2016-09-22 ENCOUNTER — Other Ambulatory Visit (HOSPITAL_COMMUNITY)
Admission: RE | Admit: 2016-09-22 | Discharge: 2016-09-22 | Disposition: A | Discharge status: Home / Self Care | Payer: Medicaid Other | Source: Ambulatory Visit | Attending: Obstetrics and Gynecology | Admitting: Obstetrics and Gynecology

## 2016-09-22 ENCOUNTER — Ambulatory Visit (INDEPENDENT_AMBULATORY_CARE_PROVIDER_SITE_OTHER): Payer: Medicaid Other | Admitting: Obstetrics and Gynecology

## 2016-09-22 ENCOUNTER — Encounter: Payer: Self-pay | Admitting: Obstetrics and Gynecology

## 2016-09-22 DIAGNOSIS — Z23 Encounter for immunization: Secondary | ICD-10-CM | POA: Diagnosis not present

## 2016-09-22 DIAGNOSIS — Z113 Encounter for screening for infections with a predominantly sexual mode of transmission: Secondary | ICD-10-CM | POA: Insufficient documentation

## 2016-09-22 DIAGNOSIS — Z01419 Encounter for gynecological examination (general) (routine) without abnormal findings: Secondary | ICD-10-CM | POA: Diagnosis present

## 2016-09-22 DIAGNOSIS — Z124 Encounter for screening for malignant neoplasm of cervix: Secondary | ICD-10-CM | POA: Diagnosis not present

## 2016-09-22 DIAGNOSIS — Z1151 Encounter for screening for human papillomavirus (HPV): Secondary | ICD-10-CM | POA: Insufficient documentation

## 2016-09-22 DIAGNOSIS — Z3480 Encounter for supervision of other normal pregnancy, unspecified trimester: Secondary | ICD-10-CM

## 2016-09-22 DIAGNOSIS — Z3482 Encounter for supervision of other normal pregnancy, second trimester: Secondary | ICD-10-CM | POA: Diagnosis not present

## 2016-09-22 DIAGNOSIS — O0990 Supervision of high risk pregnancy, unspecified, unspecified trimester: Secondary | ICD-10-CM | POA: Insufficient documentation

## 2016-09-22 NOTE — Progress Notes (Signed)
  Subjective:    [name] is a [id number] 2369w1d being seen today for her first obstetrical visit.  Her obstetrical history is significant for normal 2 previous pregnancies. Patient does intend to breast feed. Pregnancy history fully reviewed.  Patient reports some nausea relieved with medication.  Vitals:   09/22/16 1517  BP: 123/85  Pulse: 83  Temp: 97.1 F (36.2 C)  Weight: 180 lb 12.8 oz (82 kg)    HISTORY: OB History  Gravida Para Term Preterm AB Living  4 2 2  0 1 2  SAB TAB Ectopic Multiple Live Births  1 0 0 0 2    # Outcome Date GA Lbr Len/2nd Weight Sex Delivery Anes PTL Lv  4 Current           3 Term 03/14/12 6524w4d 02:12 / 00:09 6 lb 13.5 oz (3.105 kg) F Vag-Spont Local  LIV  2 Term 10/21/08 8053w0d    Vag-Spont   LIV  1 SAB              Past Medical History:  Diagnosis Date  . Asthma   . No pertinent past medical history    Past Surgical History:  Procedure Laterality Date  . NO PAST SURGERIES     History reviewed. No pertinent family history.   Exam    Uterus:   12-weeks in size  Pelvic Exam:    Perineum: No Hemorrhoids, Normal Perineum   Vulva: normal   Vagina:  normal mucosa, normal discharge   pH:    Cervix: multiparous appearance and cervix is closed and long   Adnexa: normal adnexa and no mass, fullness, tenderness   Bony Pelvis: gynecoid  System: Breast:  normal appearance, no masses or tenderness   Skin: normal coloration and turgor, no rashes    Neurologic: oriented, no focal deficits   Extremities: normal strength, tone, and muscle mass   HEENT extra ocular movement intact   Mouth/Teeth mucous membranes moist, pharynx normal without lesions and dental hygiene good   Neck supple and no masses   Cardiovascular: regular rate and rhythm   Respiratory:  chest clear, no wheezing, crepitations, rhonchi, normal symmetric air entry   Abdomen: soft, non-tender; bowel sounds normal; no masses,  no organomegaly   Urinary:       Assessment:     Pregnancy: [name] Patient Active Problem List   Diagnosis Date Noted  . Supervision of normal pregnancy, antepartum 09/22/2016        Plan:     Initial labs drawn. Prenatal vitamins. Problem list reviewed and updated. Genetic Screening discussed Quad Screen: requested.  Ultrasound discussed; fetal survey: ordered. Patient desires flu vaccine  Follow up in 4 weeks. 50% of 30 min visit spent on counseling and coordination of care.     [name] 09/22/2016

## 2016-09-22 NOTE — Patient Instructions (Signed)
Second Trimester of Pregnancy The second trimester is from week 13 through week 28 (months 4 through 6). The second trimester is often a time when you feel your best. Your body has also adjusted to being pregnant, and you begin to feel better physically. Usually, morning sickness has lessened or quit completely, you may have more energy, and you may have an increase in appetite. The second trimester is also a time when the fetus is growing rapidly. At the end of the sixth month, the fetus is about 9 inches long and weighs about 1 pounds. You will likely begin to feel the baby move (quickening) between 18 and 20 weeks of the pregnancy. Body changes during your second trimester Your body continues to go through many changes during your second trimester. The changes vary from woman to woman.  Your weight will continue to increase. You will notice your lower abdomen bulging out.  You may begin to get stretch marks on your hips, abdomen, and breasts.  You may develop headaches that can be relieved by medicines. The medicines should be approved by your health care provider.  You may urinate more often because the fetus is pressing on your bladder.  You may develop or continue to have heartburn as a result of your pregnancy.  You may develop constipation because certain hormones are causing the muscles that push waste through your intestines to slow down.  You may develop hemorrhoids or swollen, bulging veins (varicose veins).  You may have back pain. This is caused by:  Weight gain.  Pregnancy hormones that are relaxing the joints in your pelvis.  A shift in weight and the muscles that support your balance.  Your breasts will continue to grow and they will continue to become tender.  Your gums may bleed and may be sensitive to brushing and flossing.  Dark spots or blotches (chloasma, mask of pregnancy) may develop on your face. This will likely fade after the baby is born.  A dark line  from your belly button to the pubic area (linea nigra) may appear. This will likely fade after the baby is born.  You may have changes in your hair. These can include thickening of your hair, rapid growth, and changes in texture. Some women also have hair loss during or after pregnancy, or hair that feels dry or thin. Your hair will most likely return to normal after your baby is born. What to expect at prenatal visits During a routine prenatal visit:  You will be weighed to make sure you and the fetus are growing normally.  Your blood pressure will be taken.  Your abdomen will be measured to track your baby's growth.  The fetal heartbeat will be listened to.  Any test results from the previous visit will be discussed. Your health care provider may ask you:  How you are feeling.  If you are feeling the baby move.  If you have had any abnormal symptoms, such as leaking fluid, bleeding, severe headaches, or abdominal cramping.  If you are using any tobacco products, including cigarettes, chewing tobacco, and electronic cigarettes.  If you have any questions. Other tests that may be performed during your second trimester include:  Blood tests that check for:  Low iron levels (anemia).  Gestational diabetes (between 24 and 28 weeks).  Rh antibodies. This is to check for a protein on red blood cells (Rh factor).  Urine tests to check for infections, diabetes, or protein in the urine.  An ultrasound to   confirm the proper growth and development of the baby.  An amniocentesis to check for possible genetic problems.  Fetal screens for spina bifida and Down syndrome.  HIV (human immunodeficiency virus) testing. Routine prenatal testing includes screening for HIV, unless you choose not to have this test. Follow these instructions at home: Eating and drinking  Continue to eat regular, healthy meals.  Avoid raw meat, uncooked cheese, cat litter boxes, and soil used by cats. These  carry germs that can cause birth defects in the baby.  Take your prenatal vitamins.  Take 1500-2000 mg of calcium daily starting at the 20th week of pregnancy until you deliver your baby.  If you develop constipation:  Take over-the-counter or prescription medicines.  Drink enough fluid to keep your urine clear or pale yellow.  Eat foods that are high in fiber, such as fresh fruits and vegetables, whole grains, and beans.  Limit foods that are high in fat and processed sugars, such as fried and sweet foods. Activity  Exercise only as directed by your health care provider. Experiencing uterine cramps is a good sign to stop exercising.  Avoid heavy lifting, wear low heel shoes, and practice good posture.  Wear your seat belt at all times when driving.  Rest with your legs elevated if you have leg cramps or low back pain.  Wear a good support bra for breast tenderness.  Do not use hot tubs, steam rooms, or saunas. Lifestyle  Avoid all smoking, herbs, alcohol, and unprescribed drugs. These chemicals affect the formation and growth of the baby.  Do not use any products that contain nicotine or tobacco, such as cigarettes and e-cigarettes. If you need help quitting, ask your health care provider.  A sexual relationship may be continued unless your health care provider directs you otherwise. General instructions  Follow your health care provider's instructions regarding medicine use. There are medicines that are either safe or unsafe to take during pregnancy.  Take warm sitz baths to soothe any pain or discomfort caused by hemorrhoids. Use hemorrhoid cream if your health care provider approves.  If you develop varicose veins, wear support hose. Elevate your feet for 15 minutes, 3-4 times a day. Limit salt in your diet.  Visit your dentist if you have not gone yet during your pregnancy. Use a soft toothbrush to brush your teeth and be gentle when you floss.  Keep all follow-up  prenatal visits as told by your health care provider. This is important. Contact a health care provider if:  You have dizziness.  You have mild pelvic cramps, pelvic pressure, or nagging pain in the abdominal area.  You have persistent nausea, vomiting, or diarrhea.  You have a bad smelling vaginal discharge.  You have pain with urination. Get help right away if:  You have a fever.  You are leaking fluid from your vagina.  You have spotting or bleeding from your vagina.  You have severe abdominal cramping or pain.  You have rapid weight gain or weight loss.  You have shortness of breath with chest pain.  You notice sudden or extreme swelling of your face, hands, ankles, feet, or legs.  You have not felt your baby move in over an hour.  You have severe headaches that do not go away with medicine.  You have vision changes. Summary  The second trimester is from week 13 through week 28 (months 4 through 6). It is also a time when the fetus is growing rapidly.  Your body goes   through many changes during pregnancy. The changes vary from woman to woman.  Avoid all smoking, herbs, alcohol, and unprescribed drugs. These chemicals affect the formation and growth your baby.  Do not use any tobacco products, such as cigarettes, chewing tobacco, and e-cigarettes. If you need help quitting, ask your health care provider.  Contact your health care provider if you have any questions. Keep all prenatal visits as told by your health care provider. This is important. This information is not intended to replace advice given to you by your health care provider. Make sure you discuss any questions you have with your health care provider. Document Released: 10/19/2001 Document Revised: 04/01/2016 Document Reviewed: 12/26/2012 Elsevier Interactive Patient Education  2017 [employer]  Contraception Choices Contraception (birth control) is the use of any methods or devices to prevent  pregnancy. Below are some methods to help avoid pregnancy. Hormonal methods  Contraceptive implant. This is a thin, plastic tube containing progesterone hormone. It does not contain estrogen hormone. Your health care provider inserts the tube in the inner part of the upper arm. The tube can remain in place for up to 3 years. After 3 years, the implant must be removed. The implant prevents the ovaries from releasing an egg (ovulation), thickens the cervical mucus to prevent sperm from entering the uterus, and thins the lining of the inside of the uterus.  Progesterone-only injections. These injections are given every 3 months by your health care provider to prevent pregnancy. This synthetic progesterone hormone stops the ovaries from releasing eggs. It also thickens cervical mucus and changes the uterine lining. This makes it harder for sperm to survive in the uterus.  Birth control pills. These pills contain estrogen and progesterone hormone. They work by preventing the ovaries from releasing eggs (ovulation). They also cause the cervical mucus to thicken, preventing the sperm from entering the uterus. Birth control pills are prescribed by a health care provider.Birth control pills can also be used to treat heavy periods.  Minipill. This type of birth control pill contains only the progesterone hormone. They are taken every day of each month and must be prescribed by your health care provider.  Birth control patch. The patch contains hormones similar to those in birth control pills. It must be changed once a week and is prescribed by a health care provider.  Vaginal ring. The ring contains hormones similar to those in birth control pills. It is left in the vagina for 3 weeks, removed for 1 week, and then a new one is put back in place. The patient must be comfortable inserting and removing the ring from the vagina.A health care provider's prescription is necessary.  Emergency contraception.  Emergency contraceptives prevent pregnancy after unprotected sexual intercourse. This pill can be taken right after sex or up to 5 days after unprotected sex. It is most effective the sooner you take the pills after having sexual intercourse. Most emergency contraceptive pills are available without a prescription. Check with your pharmacist. Do not use emergency contraception as your only form of birth control. Barrier methods  Female condom. This is a thin sheath (latex or rubber) that is worn over the penis during sexual intercourse. It can be used with spermicide to increase effectiveness.  Female condom. This is a soft, loose-fitting sheath that is put into the vagina before sexual intercourse.  Diaphragm. This is a soft, latex, dome-shaped barrier that must be fitted by a health care provider. It is inserted into the vagina, along with a   spermicidal jelly. It is inserted before intercourse. The diaphragm should be left in the vagina for 6 to 8 hours after intercourse.  Cervical cap. This is a round, soft, latex or plastic cup that fits over the cervix and must be fitted by a health care provider. The cap can be left in place for up to 48 hours after intercourse.  Sponge. This is a soft, circular piece of polyurethane foam. The sponge has spermicide in it. It is inserted into the vagina after wetting it and before sexual intercourse.  Spermicides. These are chemicals that kill or block sperm from entering the cervix and uterus. They come in the form of creams, jellies, suppositories, foam, or tablets. They do not require a prescription. They are inserted into the vagina with an applicator before having sexual intercourse. The process must be repeated every time you have sexual intercourse. Intrauterine contraception  Intrauterine device (IUD). This is a T-shaped device that is put in a woman's uterus during a menstrual period to prevent pregnancy. There are 2 types:  Copper IUD. This type of IUD  is wrapped in copper wire and is placed inside the uterus. Copper makes the uterus and fallopian tubes produce a fluid that kills sperm. It can stay in place for 10 years.  Hormone IUD. This type of IUD contains the hormone progestin (synthetic progesterone). The hormone thickens the cervical mucus and prevents sperm from entering the uterus, and it also thins the uterine lining to prevent implantation of a fertilized egg. The hormone can weaken or kill the sperm that get into the uterus. It can stay in place for 3-5 years, depending on which type of IUD is used. Permanent methods of contraception  Female tubal ligation. This is when the woman's fallopian tubes are surgically sealed, tied, or blocked to prevent the egg from traveling to the uterus.  Hysteroscopic sterilization. This involves placing a small coil or insert into each fallopian tube. Your doctor uses a technique called hysteroscopy to do the procedure. The device causes scar tissue to form. This results in permanent blockage of the fallopian tubes, so the sperm cannot fertilize the egg. It takes about 3 months after the procedure for the tubes to become blocked. You must use another form of birth control for these 3 months.  Female sterilization. This is when the female has the tubes that carry sperm tied off (vasectomy).This blocks sperm from entering the vagina during sexual intercourse. After the procedure, the man can still ejaculate fluid (semen). Natural planning methods  Natural family planning. This is not having sexual intercourse or using a barrier method (condom, diaphragm, cervical cap) on days the woman could become pregnant.  Calendar method. This is keeping track of the length of each menstrual cycle and identifying when you are fertile.  Ovulation method. This is avoiding sexual intercourse during ovulation.  Symptothermal method. This is avoiding sexual intercourse during ovulation, using a thermometer and ovulation  symptoms.  Post-ovulation method. This is timing sexual intercourse after you have ovulated. Regardless of which type or method of contraception you choose, it is important that you use condoms to protect against the transmission of sexually transmitted infections (STIs). Talk with your health care provider about which form of contraception is most appropriate for you. This information is not intended to replace advice given to you by your health care provider. Make sure you discuss any questions you have with your health care provider. Document Released: 10/25/2005 Document Revised: 04/01/2016 Document Reviewed: 04/19/2013 Elsevier Interactive   Patient Education  2017 [employer]   Breastfeeding Deciding to breastfeed is one of the best choices you can make for you and your baby. A change in hormones during pregnancy causes your breast tissue to grow and increases the number and size of your milk ducts. These hormones also allow proteins, sugars, and fats from your blood supply to make breast milk in your milk-producing glands. Hormones prevent breast milk from being released before your baby is born as well as prompt milk flow after birth. Once breastfeeding has begun, thoughts of your baby, as well as his or her sucking or crying, can stimulate the release of milk from your milk-producing glands. Benefits of breastfeeding For Your Baby  Your first milk (colostrum) helps your baby's digestive system function better.  There are antibodies in your milk that help your baby fight off infections.  Your baby has a lower incidence of asthma, allergies, and sudden infant death syndrome.  The nutrients in breast milk are better for your baby than infant formulas and are designed uniquely for your baby's needs.  Breast milk improves your baby's brain development.  Your baby is less likely to develop other conditions, such as childhood obesity, asthma, or type 2 diabetes mellitus. For  You  Breastfeeding helps to create a very special bond between you and your baby.  Breastfeeding is convenient. Breast milk is always available at the correct temperature and costs nothing.  Breastfeeding helps to burn calories and helps you lose the weight gained during pregnancy.  Breastfeeding makes your uterus contract to its prepregnancy size faster and slows bleeding (lochia) after you give birth.  Breastfeeding helps to lower your risk of developing type 2 diabetes mellitus, osteoporosis, and breast or ovarian cancer later in life. Signs that your baby is hungry Early Signs of Hunger  Increased alertness or activity.  Stretching.  Movement of the head from side to side.  Movement of the head and opening of the mouth when the corner of the mouth or cheek is stroked (rooting).  Increased sucking sounds, smacking lips, cooing, sighing, or squeaking.  Hand-to-mouth movements.  Increased sucking of fingers or hands. Late Signs of Hunger  Fussing.  Intermittent crying. Extreme Signs of Hunger  Signs of extreme hunger will require calming and consoling before your baby will be able to breastfeed successfully. Do not wait for the following signs of extreme hunger to occur before you initiate breastfeeding:  Restlessness.  A loud, strong cry.  Screaming. Breastfeeding basics  Breastfeeding Initiation  Find a comfortable place to sit or lie down, with your neck and back well supported.  Place a pillow or rolled up blanket under your baby to bring him or her to the level of your breast (if you are seated). Nursing pillows are specially designed to help support your arms and your baby while you breastfeed.  Make sure that your baby's abdomen is facing your abdomen.  Gently massage your breast. With your fingertips, massage from your chest wall toward your nipple in a circular motion. This encourages milk flow. You may need to continue this action during the feeding if your  milk flows slowly.  Support your breast with 4 fingers underneath and your thumb above your nipple. Make sure your fingers are well away from your nipple and your baby's mouth.  Stroke your baby's lips gently with your finger or nipple.  When your baby's mouth is open wide enough, quickly bring your baby to your breast, placing your entire nipple and   as much of the colored area around your nipple (areola) as possible into your baby's mouth.  More areola should be visible above your baby's upper lip than below the lower lip.  Your baby's tongue should be between his or her lower gum and your breast.  Ensure that your baby's mouth is correctly positioned around your nipple (latched). Your baby's lips should create a seal on your breast and be turned out (everted).  It is common for your baby to suck about 2-3 minutes in order to start the flow of breast milk. Latching  Teaching your baby how to latch on to your breast properly is very important. An improper latch can cause nipple pain and decreased milk supply for you and poor weight gain in your baby. Also, if your baby is not latched onto your nipple properly, he or she may swallow some air during feeding. This can make your baby fussy. Burping your baby when you switch breasts during the feeding can help to get rid of the air. However, teaching your baby to latch on properly is still the best way to prevent fussiness from swallowing air while breastfeeding. Signs that your baby has successfully latched on to your nipple:  Silent tugging or silent sucking, without causing you pain.  Swallowing heard between every 3-4 sucks.  Muscle movement above and in front of his or her ears while sucking. Signs that your baby has not successfully latched on to nipple:  Sucking sounds or smacking sounds from your baby while breastfeeding.  Nipple pain. If you think your baby has not latched on correctly, slip your finger into the corner of your baby's  mouth to break the suction and place it between your baby's gums. Attempt breastfeeding initiation again. Signs of Successful Breastfeeding  Signs from your baby:  A gradual decrease in the number of sucks or complete cessation of sucking.  Falling asleep.  Relaxation of his or her body.  Retention of a small amount of milk in his or her mouth.  Letting go of your breast by himself or herself. Signs from you:  Breasts that have increased in firmness, weight, and size 1-3 hours after feeding.  Breasts that are softer immediately after breastfeeding.  Increased milk volume, as well as a change in milk consistency and color by the fifth day of breastfeeding.  Nipples that are not sore, cracked, or bleeding. Signs That Your Baby is Getting Enough Milk  Wetting at least 1-2 diapers during the first 24 hours after birth.  Wetting at least 5-6 diapers every 24 hours for the first week after birth. The urine should be clear or pale yellow by 5 days after birth.  Wetting 6-8 diapers every 24 hours as your baby continues to grow and develop.  At least 3 stools in a 24-hour period by age 5 days. The stool should be soft and yellow.  At least 3 stools in a 24-hour period by age 7 days. The stool should be seedy and yellow.  No loss of weight greater than 10% of birth weight during the first 3 days of age.  Average weight gain of 4-7 ounces (113-198 g) per week after age 4 days.  Consistent daily weight gain by age 5 days, without weight loss after the age of 2 weeks. After a feeding, your baby may spit up a small amount. This is common. Breastfeeding frequency and duration Frequent feeding will help you make more milk and can prevent sore nipples and breast engorgement. Breastfeed   when you feel the need to reduce the fullness of your breasts or when your baby shows signs of hunger. This is called "breastfeeding on demand." Avoid introducing a pacifier to your baby while you are working  to establish breastfeeding (the first 4-6 weeks after your baby is born). After this time you may choose to use a pacifier. Research has shown that pacifier use during the first year of a baby's life decreases the risk of sudden infant death syndrome (SIDS). Allow your baby to feed on each breast as long as he or she wants. Breastfeed until your baby is finished feeding. When your baby unlatches or falls asleep while feeding from the first breast, offer the second breast. Because newborns are often sleepy in the first few weeks of life, you may need to awaken your baby to get him or her to feed. Breastfeeding times will vary from baby to baby. However, the following rules can serve as a guide to help you ensure that your baby is properly fed:  Newborns (babies 4 weeks of age or younger) may breastfeed every 1-3 hours.  Newborns should not go longer than 3 hours during the day or 5 hours during the night without breastfeeding.  You should breastfeed your baby a minimum of 8 times in a 24-hour period until you begin to introduce solid foods to your baby at around 6 months of age. Breast milk pumping Pumping and storing breast milk allows you to ensure that your baby is exclusively fed your breast milk, even at times when you are unable to breastfeed. This is especially important if you are going back to work while you are still breastfeeding or when you are not able to be present during feedings. Your lactation consultant can give you guidelines on how long it is safe to store breast milk. A breast pump is a machine that allows you to pump milk from your breast into a sterile bottle. The pumped breast milk can then be stored in a refrigerator or freezer. Some breast pumps are operated by hand, while others use electricity. Ask your lactation consultant which type will work best for you. Breast pumps can be purchased, but some hospitals and breastfeeding support groups lease breast pumps on a monthly basis.  A lactation consultant can teach you how to hand express breast milk, if you prefer not to use a pump. Caring for your breasts while you breastfeed Nipples can become dry, cracked, and sore while breastfeeding. The following recommendations can help keep your breasts moisturized and healthy:  Avoid using soap on your nipples.  Wear a supportive bra. Although not required, special nursing bras and tank tops are designed to allow access to your breasts for breastfeeding without taking off your entire bra or top. Avoid wearing underwire-style bras or extremely tight bras.  Air dry your nipples for 3-4minutes after each feeding.  Use only cotton bra pads to absorb leaked breast milk. Leaking of breast milk between feedings is normal.  Use lanolin on your nipples after breastfeeding. Lanolin helps to maintain your skin's normal moisture barrier. If you use pure lanolin, you do not need to wash it off before feeding your baby again. Pure lanolin is not toxic to your baby. You may also hand express a few drops of breast milk and gently massage that milk into your nipples and allow the milk to air dry. In the first few weeks after giving birth, some women experience extremely full breasts (engorgement). Engorgement can make your breasts   feel heavy, warm, and tender to the touch. Engorgement peaks within 3-5 days after you give birth. The following recommendations can help ease engorgement:  Completely empty your breasts while breastfeeding or pumping. You may want to start by applying warm, moist heat (in the shower or with warm water-soaked hand towels) just before feeding or pumping. This increases circulation and helps the milk flow. If your baby does not completely empty your breasts while breastfeeding, pump any extra milk after he or she is finished.  Wear a snug bra (nursing or regular) or tank top for 1-2 days to signal your body to slightly decrease milk production.  Apply ice packs to your  breasts, unless this is too uncomfortable for you.  Make sure that your baby is latched on and positioned properly while breastfeeding. If engorgement persists after 48 hours of following these recommendations, contact your health care provider or a lactation consultant. Overall health care recommendations while breastfeeding  Eat healthy foods. Alternate between meals and snacks, eating 3 of each per day. Because what you eat affects your breast milk, some of the foods may make your baby more irritable than usual. Avoid eating these foods if you are sure that they are negatively affecting your baby.  Drink milk, fruit juice, and water to satisfy your thirst (about 10 glasses a day).  Rest often, relax, and continue to take your prenatal vitamins to prevent fatigue, stress, and anemia.  Continue breast self-awareness checks.  Avoid chewing and smoking tobacco. Chemicals from cigarettes that pass into breast milk and exposure to secondhand smoke may harm your baby.  Avoid alcohol and drug use, including marijuana. Some medicines that may be harmful to your baby can pass through breast milk. It is important to ask your health care provider before taking any medicine, including all over-the-counter and prescription medicine as well as vitamin and herbal supplements. It is possible to become pregnant while breastfeeding. If birth control is desired, ask your health care provider about options that will be safe for your baby. Contact a health care provider if:  You feel like you want to stop breastfeeding or have become frustrated with breastfeeding.  You have painful breasts or nipples.  Your nipples are cracked or bleeding.  Your breasts are red, tender, or warm.  You have a swollen area on either breast.  You have a fever or chills.  You have nausea or vomiting.  You have drainage other than breast milk from your nipples.  Your breasts do not become full before feedings by the  fifth day after you give birth.  You feel sad and depressed.  Your baby is too sleepy to eat well.  Your baby is having trouble sleeping.  Your baby is wetting less than 3 diapers in a 24-hour period.  Your baby has less than 3 stools in a 24-hour period.  Your baby's skin or the white part of his or her eyes becomes yellow.  Your baby is not gaining weight by 5 days of age. Get help right away if:  Your baby is overly tired (lethargic) and does not want to wake up and feed.  Your baby develops an unexplained fever. This information is not intended to replace advice given to you by your health care provider. Make sure you discuss any questions you have with your health care provider. Document Released: 10/25/2005 Document Revised: 04/07/2016 Document Reviewed: 04/18/2013 Elsevier Interactive Patient Education  2017 Elsevier Inc.  

## 2016-09-22 NOTE — Progress Notes (Signed)
Patient reports uterine irritability, no bleeding.

## 2016-09-23 LAB — GC/CHLAMYDIA PROBE AMP (~~LOC~~) NOT AT ARMC
Chlamydia: NEGATIVE
Neisseria Gonorrhea: NEGATIVE

## 2016-09-24 LAB — CYTOLOGY - PAP
DIAGNOSIS: NEGATIVE
HPV (WINDOPATH): NOT DETECTED

## 2016-09-24 LAB — URINE CULTURE, OB REFLEX

## 2016-09-24 LAB — CULTURE, OB URINE

## 2016-09-29 ENCOUNTER — Telehealth: Payer: Self-pay | Admitting: *Deleted

## 2016-09-29 ENCOUNTER — Other Ambulatory Visit: Payer: Self-pay | Admitting: Obstetrics and Gynecology

## 2016-09-29 ENCOUNTER — Encounter: Payer: Self-pay | Admitting: Obstetrics and Gynecology

## 2016-09-29 DIAGNOSIS — Z2839 Other underimmunization status: Secondary | ICD-10-CM | POA: Insufficient documentation

## 2016-09-29 DIAGNOSIS — O09899 Supervision of other high risk pregnancies, unspecified trimester: Secondary | ICD-10-CM | POA: Insufficient documentation

## 2016-09-29 DIAGNOSIS — Z283 Underimmunization status: Secondary | ICD-10-CM

## 2016-09-29 LAB — HEMOGLOBINOPATHY EVALUATION
HEMOGLOBIN F QUANTITATION: 0 % (ref 0.0–2.0)
HGB C: 0 %
HGB S: 0 %
Hemoglobin A2 Quantitation: 2.4 % (ref 0.7–3.1)
Hgb A: 97.6 % (ref 94.0–98.0)

## 2016-09-29 LAB — CYSTIC FIBROSIS MUTATION 97: GENE DIS ANAL CARRIER INTERP BLD/T-IMP: NOT DETECTED

## 2016-09-29 LAB — OBSTETRIC PANEL, INCLUDING HIV
Antibody Screen: NEGATIVE
BASOS ABS: 0 10*3/uL (ref 0.0–0.2)
Basos: 0 %
EOS (ABSOLUTE): 0.4 10*3/uL (ref 0.0–0.4)
EOS: 4 %
HEMOGLOBIN: 11 g/dL — AB (ref 11.1–15.9)
HIV Screen 4th Generation wRfx: NONREACTIVE
Hematocrit: 34.4 % (ref 34.0–46.6)
Hepatitis B Surface Ag: NEGATIVE
IMMATURE GRANS (ABS): 0 10*3/uL (ref 0.0–0.1)
IMMATURE GRANULOCYTES: 0 %
LYMPHS ABS: 2.6 10*3/uL (ref 0.7–3.1)
LYMPHS: 29 %
MCH: 25.3 pg — ABNORMAL LOW (ref 26.6–33.0)
MCHC: 32 g/dL (ref 31.5–35.7)
MCV: 79 fL (ref 79–97)
MONOS ABS: 0.6 10*3/uL (ref 0.1–0.9)
Monocytes: 6 %
NEUTROS PCT: 61 %
Neutrophils Absolute: 5.4 10*3/uL (ref 1.4–7.0)
PLATELETS: 235 10*3/uL (ref 150–379)
RBC: 4.34 x10E6/uL (ref 3.77–5.28)
RDW: 13.2 % (ref 12.3–15.4)
RH TYPE: POSITIVE
RPR Ser Ql: NONREACTIVE
Rubella Antibodies, IGG: 1.55 index (ref >0.99)
WBC: 9 10*3/uL (ref 3.4–10.8)

## 2016-09-29 LAB — TOXASSURE SELECT 13 (MW), URINE

## 2016-09-29 LAB — VARICELLA ZOSTER ANTIBODY, IGG

## 2016-09-29 MED ORDER — ONDANSETRON 4 MG PO TBDP: 4.0000 mg | ORAL_TABLET | Freq: Four times a day (QID) | ORAL | 0 refills | Status: DC | PRN

## 2016-09-29 MED ORDER — PRENATAL VITAMINS 0.8 MG PO TABS: 1.0000 | ORAL_TABLET | Freq: Every day | ORAL | 12 refills | Status: DC

## 2016-09-29 NOTE — Telephone Encounter (Signed)
Patient states she needs prenatal vitamins and something for nausea sent to her pharmacy. Told patient I would make her provider aware- but to give her until the end of day before she went to her pharmacy.

## 2016-10-20 ENCOUNTER — Ambulatory Visit (INDEPENDENT_AMBULATORY_CARE_PROVIDER_SITE_OTHER): Payer: Medicaid Other | Admitting: Obstetrics & Gynecology

## 2016-10-20 DIAGNOSIS — Z3482 Encounter for supervision of other normal pregnancy, second trimester: Secondary | ICD-10-CM | POA: Diagnosis not present

## 2016-10-20 DIAGNOSIS — Z3480 Encounter for supervision of other normal pregnancy, unspecified trimester: Secondary | ICD-10-CM

## 2016-10-20 NOTE — Progress Notes (Signed)
Pt states she has some skin changes on bach/shoulder area, sometimes painful.

## 2016-10-20 NOTE — Progress Notes (Signed)
   PRENATAL VISIT NOTE  Subjective:  Vanessa Marks is a 35 y.o. [id number] at 6037w1d being seen today for ongoing prenatal care.  She is currently monitored for the following issues for this low-risk pregnancy and has Supervision of normal pregnancy, antepartum and Susceptible to varicella (non-immune), currently pregnant on her problem list.  Patient reports rash on her shoulders for several weeks..  Contractions: Not present. Vag. Bleeding: None.  Movement: Present. Denies leaking of fluid.   The following portions of the patient's history were reviewed and updated as appropriate: allergies, current medications, past family history, past medical history, past social history, past surgical history and problem list. Problem list updated.  Objective:   Vitals:   10/20/16 0937  BP: 129/75  Pulse: 90  Weight: 184 lb (83.5 kg)    Fetal Status:     Movement: Present     General:  Alert, oriented and cooperative. Patient is in no acute distress.  Skin: Skin is warm and dry. 2 areas of ring worm on her back.  Cardiovascular: Normal heart rate noted  Respiratory: Normal respiratory effort, no problems with respiration noted  Abdomen: Soft, gravid, appropriate for gestational age. Pain/Pressure: Absent     Pelvic:  Cervical exam deferred        Extremities: Normal range of motion.     Mental Status: Normal mood and affect. Normal behavior. Normal judgment and thought content.   Assessment and Plan:  Pregnancy: [id number] at 2037w1d  1. Supervision of other normal pregnancy, antepartum  - AFP, Quad Screen 2. Ring worm infection- rec OTC antifungals   Preterm labor symptoms and general obstetric precautions including but not limited to vaginal bleeding, contractions, leaking of fluid and fetal movement were reviewed in detail with the patient. Please refer to After Visit Summary for other counseling recommendations.  No Follow-up on file.   Allie BossierMyra C Nikea Settle, MD

## 2016-10-22 LAB — AFP, QUAD SCREEN
DIA Mom Value: 1.72
DIA VALUE (EIA): 265.33 pg/mL
DSR (By Age)    1 IN: 338
DSR (Second Trimester) 1 IN: 50
GESTATIONAL AGE AFP: 16.9 wk
MSAFP Mom: 0.73
MSAFP: 23.8 ng/mL
MSHCG MOM: 2.13
MSHCG: 60790 m[IU]/mL
Maternal Age At EDD: 34.4 YEARS
Osb Risk: 10000
Test Results:: POSITIVE — AB
UE3 MOM: 0.65
Weight: 184 [lb_av]
uE3 Value: 0.63 ng/mL

## 2016-10-26 ENCOUNTER — Telehealth: Payer: Self-pay

## 2016-10-26 ENCOUNTER — Other Ambulatory Visit: Payer: Self-pay | Admitting: *Deleted

## 2016-10-26 DIAGNOSIS — O280 Abnormal hematological finding on antenatal screening of mother: Secondary | ICD-10-CM

## 2016-10-26 DIAGNOSIS — Z3492 Encounter for supervision of normal pregnancy, unspecified, second trimester: Secondary | ICD-10-CM

## 2016-10-26 NOTE — Telephone Encounter (Signed)
Returned call and advised of results and MFM follow up.

## 2016-10-26 NOTE — Progress Notes (Signed)
Orders for MFM and u/s were entered per Dr Marice Potterove for abnormal quad screen.  Attempt to contact pt to make her aware of results and orders. LM on VM to call office.

## 2016-11-04 ENCOUNTER — Other Ambulatory Visit: Payer: Medicaid Other

## 2016-11-08 NOTE — L&D Delivery Note (Signed)
36 y.o. [id number] at 5334w0d had been pushing for 1 hour after second stage began. She progressed from 0 station to +2. FHT showed prolonged decels with every contraction/push to 60-80s, with recovery to baseline between contractions or when not pushing.  Indication for operative vaginal delivery: Fetal distress with pushing/contractions  Risks of forceps assistance were discussed in detail, including but not limited to, bleeding, infection, damage to maternal tissues, fetal cephalohematoma, inability to effect vaginal delivery of the head or shoulder dystocia that cannot be resolved by established maneuvers and need for emergency cesarean section.  Patient gave verbal consent.  Patient was examined and found to be fully dilated with fetal station of +2 (low position). The Luikart's forceps were positioned appropriately with clearing of maternal soft tissue, good articulation was noted. The patient was instructed to push during the next contraction. Pulling was administered along the pelvic curve.  A single pull was administered during one push, a midline episiotomy was performed.   The infant was then delivered atraumatically, noted to be a viable female infant, Apgars of 9 and 9, weight is pending skin to skin.  There was spontaneous placental delivery, intact with three-vessel cord.   Anesthesia: Epidural Laceration: 3rd degree, class 3-B (>50% EAS torn, but still intact) Suture: 3-0 Vicryl; 2-0 Vicryl Good hemostasis noted. EBL: 350cc  Mom and baby recovering in LDR.    Apgars: APGAR (1 MIN): 9   APGAR (5 MINS): 9      Weight: Pending skin to skin  Sponge and instrument count were correct x2. Placenta sent to L&D.  Jen MowElizabeth Mumaw, DO OB Fellow [facility] 03/22/2017, 3:51 AM  I performed the forcep's delivery and completed repair as noted above.  Nettie ElmMichael Callaway Hailes, MD

## 2016-11-09 ENCOUNTER — Other Ambulatory Visit: Payer: Self-pay | Admitting: Obstetrics & Gynecology

## 2016-11-09 ENCOUNTER — Ambulatory Visit (HOSPITAL_COMMUNITY)
Admission: RE | Admit: 2016-11-09 | Discharge: 2016-11-09 | Disposition: A | Discharge status: Home / Self Care | Payer: Medicaid Other | Source: Ambulatory Visit | Attending: Obstetrics & Gynecology | Admitting: Obstetrics & Gynecology

## 2016-11-09 ENCOUNTER — Encounter (HOSPITAL_COMMUNITY): Payer: Self-pay

## 2016-11-09 DIAGNOSIS — Z363 Encounter for antenatal screening for malformations: Secondary | ICD-10-CM

## 2016-11-09 DIAGNOSIS — O283 Abnormal ultrasonic finding on antenatal screening of mother: Secondary | ICD-10-CM | POA: Diagnosis not present

## 2016-11-09 DIAGNOSIS — Z315 Encounter for genetic counseling: Secondary | ICD-10-CM | POA: Insufficient documentation

## 2016-11-09 DIAGNOSIS — Z3A19 19 weeks gestation of pregnancy: Secondary | ICD-10-CM

## 2016-11-09 DIAGNOSIS — O280 Abnormal hematological finding on antenatal screening of mother: Secondary | ICD-10-CM

## 2016-11-09 DIAGNOSIS — O09899 Supervision of other high risk pregnancies, unspecified trimester: Secondary | ICD-10-CM

## 2016-11-09 DIAGNOSIS — Z283 Underimmunization status: Secondary | ICD-10-CM

## 2016-11-09 DIAGNOSIS — Z3492 Encounter for supervision of normal pregnancy, unspecified, second trimester: Secondary | ICD-10-CM

## 2016-11-09 DIAGNOSIS — O281 Abnormal biochemical finding on antenatal screening of mother: Secondary | ICD-10-CM

## 2016-11-09 DIAGNOSIS — Z3490 Encounter for supervision of normal pregnancy, unspecified, unspecified trimester: Secondary | ICD-10-CM

## 2016-11-09 NOTE — Progress Notes (Signed)
Genetic Counseling  High-Risk Gestation Note  Appointment Date:  11/09/2016 Referred By: Allie Bossier, MD Date of Birth:  Feb 02, 1981   Pregnancy History: [id number] Estimated Date of Delivery: 04/05/17 Estimated Gestational Age: [redacted]w[redacted]d Attending: Charlsie Merles, MD   Ms. Shinika Butzin was seen for genetic counseling because of an increased risk for fetal Down syndrome based on Quad screen through [employer].  In summary:  Reviewed results of Quad screening test  Increased risk for Down syndrome (1 in 50)  Discussed additional screening options  NIPS- patient expressed interest but declined blood draw at today's visit  Ultrasound- performed today; visualized fetal anatomy within normal limits  Discussed diagnostic testing options  Amniocentesis- declined  Reviewed family history concerns  She was counseled regarding the screening result and the associated 1 in 50 risk for fetal Down syndrome.  We reviewed chromosomes, nondisjunction, and the common features and variable prognosis of Down syndrome.  In addition, we reviewed the screen adjusted reduction in risks for trisomy 18 and open neural tube defects.  We also discussed other explanations for a screen positive result including: a gestational dating error, differences in maternal metabolism, and normal variation.  We reviewed other available screening options including noninvasive prenatal screening (NIPS)/cell free DNA (cfDNA) screening and detailed ultrasound.  She was counseled that screening tests are used to modify a patient's a priori risk for aneuploidy, typically based on age. This estimate provides a pregnancy specific risk assessment. We reviewed the benefits and limitations of each option. Specifically, we discussed the conditions for which each test screens, the detection rates, and false positive rates of each. She was also counseled regarding diagnostic testing via amniocentesis. We reviewed the approximate 1 in 300-500 risk  for complications from amniocentesis, including spontaneous pregnancy loss. We discussed the possible results that the tests might provide including: positive, negative, unanticipated, and no result. Finally, they were counseled regarding the cost of each option and potential out of pocket expenses.   A detailed ultrasound was performed today. The ultrasound report is under separate cover. There were no visualized fetal anomalies or markers suggestive of aneuploidy. Limited views of fetal heart were obtained today. Follow-up ultrasound scheduled for 12/07/16.     After consideration of all the options, she expressed interest in pursuing NIPS (Panorama) but declined a blood draw at the time of today's appointment. She plans to call our office back when she would like to pursue this testing and may possibly pursue it at her follow-up ultrasound visit. She declined amniocentesis. She understands that screening tests cannot rule out all birth defects or genetic syndromes. The patient was advised of this limitation and states she still does not want additional testing at this time.   Both family histories were reviewed and found to be noncontributory for birth defects, intellectual disability, recurrent pregnancy loss, or known genetic conditions. Consanguinity was denied. Without further information regarding the provided family history, an accurate genetic risk cannot be calculated. Further genetic counseling is warranted if more information is obtained.  The father of the pregnancy is reportedly 39 years old. Advanced paternal age (APA) is defined as paternal age greater than or equal to age 35.  Recent large-scale sequencing studies have shown that approximately 80% of de novo point mutations are of paternal origin.  Many studies have demonstrated a strong correlation between increased paternal age and de novo point mutations.  Although no specific data is available regarding fetal risks for fathers 35+ years  old at conception, it is apparent  that the overall risk for single gene conditions is increased.  To estimate the relative increase in risk of a genetic disorder with APA, the heritability of the disease must be considered.  Assuming an approximate 2x increase in risk for conditions that are exclusively paternal in origin, the risk for each individual condition is still relatively low.  It is estimated that the overall chance for a de novo mutation is ~0.5%.  There is a wide range of conditions which can be caused by new dominant gene mutations (achondroplasia, neurofibromatosis, Marfan syndrome etc.).    We discussed the recommendation for a detailed ultrasound at 18+ weeks gestation and a follow up ultrasound at ~28 weeks to monitor fetal growth.  Ms. Brain HiltsSalma Micucci denied exposure to environmental toxins or chemical agents. She denied the use of alcohol, tobacco or street drugs. She denied significant viral illnesses during the course of her pregnancy. Her medical and surgical histories were noncontributory.   I counseled Ms. Deb Gundlach for approximately 45 minutes regarding the above risks and available options.   Quinn PlowmanKaren Johnothan Bascomb, MS,  Certified Genetic Counselor 11/09/2016

## 2016-11-10 ENCOUNTER — Other Ambulatory Visit (HOSPITAL_COMMUNITY): Payer: Self-pay | Admitting: *Deleted

## 2016-11-10 DIAGNOSIS — IMO0002 Reserved for concepts with insufficient information to code with codable children: Secondary | ICD-10-CM

## 2016-11-10 DIAGNOSIS — Z0489 Encounter for examination and observation for other specified reasons: Secondary | ICD-10-CM

## 2016-11-17 ENCOUNTER — Encounter: Payer: Self-pay | Admitting: Obstetrics & Gynecology

## 2016-11-17 ENCOUNTER — Ambulatory Visit (INDEPENDENT_AMBULATORY_CARE_PROVIDER_SITE_OTHER): Payer: Medicaid Other | Admitting: Obstetrics & Gynecology

## 2016-11-17 DIAGNOSIS — Z3482 Encounter for supervision of other normal pregnancy, second trimester: Secondary | ICD-10-CM

## 2016-11-17 DIAGNOSIS — Z3480 Encounter for supervision of other normal pregnancy, unspecified trimester: Secondary | ICD-10-CM

## 2016-11-17 NOTE — Progress Notes (Signed)
Patient states that she feels good, reports good fetal movement. 

## 2016-11-17 NOTE — Progress Notes (Signed)
   PRENATAL VISIT NOTE  Subjective:  Vanessa Marks is a 36 y.o. [id number] at 3722w1d being seen today for ongoing prenatal care.  She is currently monitored for the following issues for this low-risk pregnancy and has Supervision of normal pregnancy, antepartum; Susceptible to varicella (non-immune), currently pregnant; and Abnormal MSAFP (maternal serum alpha-fetoprotein), decreased on her problem list.  Patient reports no complaints.  Contractions: Not present. Vag. Bleeding: None.  Movement: Present. Denies leaking of fluid.   The following portions of the patient's history were reviewed and updated as appropriate: allergies, current medications, past family history, past medical history, past social history, past surgical history and problem list. Problem list updated.  Objective:   Vitals:   11/17/16 0918  BP: 124/79  Pulse: 84  Temp: 98.7 F (37.1 C)  Weight: 186 lb (84.4 kg)    Fetal Status: Fetal Heart Rate (bpm): 140 Fundal Height: 20 cm Movement: Present     General:  Alert, oriented and cooperative. Patient is in no acute distress.  Skin: Skin is warm and dry. No rash noted.   Cardiovascular: Normal heart rate noted  Respiratory: Normal respiratory effort, no problems with respiration noted  Abdomen: Soft, gravid, appropriate for gestational age. Pain/Pressure: Absent     Pelvic:  Cervical exam deferred        Extremities: Normal range of motion.  Edema: None  Mental Status: Normal mood and affect. Normal behavior. Normal judgment and thought content.   Assessment and Plan:  Pregnancy: [id number] at 4422w1d  1. Supervision of other normal pregnancy, antepartum Had abnormal quad screen DSR 1:50, declined amniocentesis or NIPS. Anatomy scan scheduled on 12/07/16, will follow up results and manage accordingly. No other complaints or concerns.  Routine obstetric precautions reviewed. Please refer to After Visit Summary for other counseling recommendations.  Return in about 4  weeks (around 12/15/2016) for OB Visit.   Vanessa NewcomerUgonna A Delando Satter, MD

## 2016-11-17 NOTE — Patient Instructions (Signed)
Return to clinic for any scheduled appointments or obstetric concerns, or go to [facility] for evaluation  

## 2016-12-07 ENCOUNTER — Encounter (HOSPITAL_COMMUNITY): Payer: Self-pay

## 2016-12-07 ENCOUNTER — Other Ambulatory Visit (HOSPITAL_COMMUNITY): Payer: Self-pay | Admitting: Obstetrics and Gynecology

## 2016-12-07 ENCOUNTER — Ambulatory Visit (HOSPITAL_COMMUNITY)
Admission: RE | Admit: 2016-12-07 | Discharge: 2016-12-07 | Disposition: A | Discharge status: Home / Self Care | Payer: Medicaid Other | Source: Ambulatory Visit | Attending: Obstetrics & Gynecology | Admitting: Obstetrics & Gynecology

## 2016-12-07 DIAGNOSIS — Z362 Encounter for other antenatal screening follow-up: Secondary | ICD-10-CM | POA: Insufficient documentation

## 2016-12-07 DIAGNOSIS — Z3A23 23 weeks gestation of pregnancy: Secondary | ICD-10-CM | POA: Diagnosis present

## 2016-12-07 DIAGNOSIS — Z0489 Encounter for examination and observation for other specified reasons: Secondary | ICD-10-CM

## 2016-12-07 DIAGNOSIS — O289 Unspecified abnormal findings on antenatal screening of mother: Secondary | ICD-10-CM | POA: Diagnosis not present

## 2016-12-07 DIAGNOSIS — IMO0002 Reserved for concepts with insufficient information to code with codable children: Secondary | ICD-10-CM

## 2016-12-16 ENCOUNTER — Ambulatory Visit (INDEPENDENT_AMBULATORY_CARE_PROVIDER_SITE_OTHER): Payer: Medicaid Other | Admitting: Obstetrics and Gynecology

## 2016-12-16 VITALS — BP 126/76 | HR 97 | Temp 97.9°F | Wt 193.1 lb

## 2016-12-16 DIAGNOSIS — Z3492 Encounter for supervision of normal pregnancy, unspecified, second trimester: Secondary | ICD-10-CM

## 2016-12-16 DIAGNOSIS — O09899 Supervision of other high risk pregnancies, unspecified trimester: Secondary | ICD-10-CM

## 2016-12-16 DIAGNOSIS — O280 Abnormal hematological finding on antenatal screening of mother: Secondary | ICD-10-CM | POA: Diagnosis not present

## 2016-12-16 DIAGNOSIS — Z283 Underimmunization status: Secondary | ICD-10-CM

## 2016-12-16 DIAGNOSIS — Z2839 Other underimmunization status: Secondary | ICD-10-CM

## 2016-12-16 DIAGNOSIS — Z3490 Encounter for supervision of normal pregnancy, unspecified, unspecified trimester: Secondary | ICD-10-CM

## 2016-12-16 NOTE — Progress Notes (Signed)
   PRENATAL VISIT NOTE  Subjective:  [name] is a 36 y.o. [id number] at 6018w2d being seen today for ongoing prenatal care.  She is currently monitored for the following issues for this low-risk pregnancy and has Supervision of normal pregnancy, antepartum; Susceptible to varicella (non-immune), currently pregnant; and Abnormal MSAFP (maternal serum alpha-fetoprotein), decreased on her problem list.  Patient reports no complaints.  Contractions: Irritability. Vag. Bleeding: None.  Movement: Present. Denies leaking of fluid.   The following portions of the patient's history were reviewed and updated as appropriate: allergies, current medications, past family history, past medical history, past social history, past surgical history and problem list. Problem list updated.  Objective:   Vitals:   12/16/16 1004  BP: 126/76  Pulse: 97  Temp: 97.9 F (36.6 C)  Weight: 193 lb 1.6 oz (87.6 kg)    Fetal Status: Fetal Heart Rate (bpm): 143 Fundal Height: 24 cm Movement: Present     General:  Alert, oriented and cooperative. Patient is in no acute distress.  Skin: Skin is warm and dry. No rash noted.   Cardiovascular: Normal heart rate noted  Respiratory: Normal respiratory effort, no problems with respiration noted  Abdomen: Soft, gravid, appropriate for gestational age. Pain/Pressure: Present     Pelvic:  Cervical exam deferred        Extremities: Normal range of motion.  Edema: None  Mental Status: Normal mood and affect. Normal behavior. Normal judgment and thought content.   Assessment and Plan:  Pregnancy: [id number] at 8218w2d  1. Encounter for supervision of normal pregnancy, antepartum, unspecified gravidity Patient is doing well without complaints Reviewed anatomy ultrasound with patient Third trimester labs, and glucola next visit  2. Abnormal MSAFP (maternal serum alpha-fetoprotein), decreased Elevated risk for DS. Patient declined NIPS  3. Susceptible to varicella  (non-immune), currently pregnant Will offer pp  Preterm labor symptoms and general obstetric precautions including but not limited to vaginal bleeding, contractions, leaking of fluid and fetal movement were reviewed in detail with the patient. Please refer to After Visit Summary for other counseling recommendations.  Return in about 4 weeks (around 01/13/2017) for ROB and 2 hr glucola.   Vanessa AntiguaPeggy Juan Kissoon, MD

## 2017-01-13 ENCOUNTER — Other Ambulatory Visit: Payer: Medicaid Other

## 2017-01-13 ENCOUNTER — Ambulatory Visit (INDEPENDENT_AMBULATORY_CARE_PROVIDER_SITE_OTHER): Payer: Medicaid Other | Admitting: Obstetrics & Gynecology

## 2017-01-13 VITALS — BP 115/74 | HR 92 | Wt 195.0 lb

## 2017-01-13 DIAGNOSIS — Z3483 Encounter for supervision of other normal pregnancy, third trimester: Secondary | ICD-10-CM

## 2017-01-13 DIAGNOSIS — Z3480 Encounter for supervision of other normal pregnancy, unspecified trimester: Secondary | ICD-10-CM

## 2017-01-13 NOTE — Progress Notes (Signed)
   PRENATAL VISIT NOTE  Subjective:  Vanessa Marks is a 36 y.o. [id number] at 4366w2d being seen today for ongoing prenatal care.  She is currently monitored for the following issues for this low-risk pregnancy and has Supervision of normal pregnancy, antepartum; Susceptible to varicella (non-immune), currently pregnant; and Abnormal MSAFP (maternal serum alpha-fetoprotein), decreased on her problem list.  Patient reports no complaints.  Contractions: Not present. Vag. Bleeding: None.  Movement: Present. Denies leaking of fluid.   The following portions of the patient's history were reviewed and updated as appropriate: allergies, current medications, past family history, past medical history, past social history, past surgical history and problem list. Problem list updated.  Objective:   Vitals:   01/13/17 0951  BP: 115/74  Pulse: 92  Weight: 195 lb (88.5 kg)    Fetal Status: Fetal Heart Rate (bpm): 135 Fundal Height: 28 cm Movement: Present     General:  Alert, oriented and cooperative. Patient is in no acute distress.  Skin: Skin is warm and dry. No rash noted.   Cardiovascular: Normal heart rate noted  Respiratory: Normal respiratory effort, no problems with respiration noted  Abdomen: Soft, gravid, appropriate for gestational age. Pain/Pressure: Present     Pelvic:  Cervical exam deferred        Extremities: Normal range of motion.  Edema: None  Mental Status: Normal mood and affect. Normal behavior. Normal judgment and thought content.   Assessment and Plan:  Pregnancy: [name] at 6466w2d  1. Supervision of other normal pregnancy, antepartum Third trimester labs and Tdap today. - Glucose Tolerance, 2 Hours w/1 Hour - CBC - HIV antibody (with reflex) - RPR - Tdap vaccine greater than or equal to 7yo IM Preterm labor symptoms and general obstetric precautions including but not limited to vaginal bleeding, contractions, leaking of fluid and fetal movement were reviewed in detail  with the patient. Please refer to After Visit Summary for other counseling recommendations.  Return in about 2 weeks (around 01/27/2017) for OB Visit.   Tereso NewcomerUgonna A Benino Korinek, MD

## 2017-01-13 NOTE — Patient Instructions (Signed)
Return to clinic for any scheduled appointments or obstetric concerns, or go to [facility] for evaluation  

## 2017-01-13 NOTE — Addendum Note (Signed)
Addended by: Marya LandryFOSTER, Brantlee Hinde D on: 01/13/2017 10:34 AM   Modules accepted: Orders

## 2017-01-14 ENCOUNTER — Encounter: Payer: Self-pay | Admitting: Obstetrics & Gynecology

## 2017-01-14 DIAGNOSIS — O24419 Gestational diabetes mellitus in pregnancy, unspecified control: Secondary | ICD-10-CM | POA: Insufficient documentation

## 2017-01-14 LAB — CBC
HEMATOCRIT: 33.4 % — AB (ref 34.0–46.6)
Hemoglobin: 10.6 g/dL — ABNORMAL LOW (ref 11.1–15.9)
MCH: 26 pg — ABNORMAL LOW (ref 26.6–33.0)
MCHC: 31.7 g/dL (ref 31.5–35.7)
MCV: 82 fL (ref 79–97)
PLATELETS: 214 10*3/uL (ref 150–379)
RBC: 4.07 x10E6/uL (ref 3.77–5.28)
RDW: 14.3 % (ref 12.3–15.4)
WBC: 6.6 10*3/uL (ref 3.4–10.8)

## 2017-01-14 LAB — GLUCOSE TOLERANCE, 2 HOURS W/ 1HR
GLUCOSE, FASTING: 96 mg/dL — AB (ref 65–91)
Glucose, 1 hour: 155 mg/dL (ref 65–179)
Glucose, 2 hour: 152 mg/dL (ref 65–152)

## 2017-01-14 LAB — HIV ANTIBODY (ROUTINE TESTING W REFLEX): HIV Screen 4th Generation wRfx: NONREACTIVE

## 2017-01-14 LAB — RPR: RPR Ser Ql: NONREACTIVE

## 2017-01-18 ENCOUNTER — Other Ambulatory Visit: Payer: Self-pay | Admitting: *Deleted

## 2017-01-18 DIAGNOSIS — O24419 Gestational diabetes mellitus in pregnancy, unspecified control: Secondary | ICD-10-CM

## 2017-01-25 ENCOUNTER — Ambulatory Visit (INDEPENDENT_AMBULATORY_CARE_PROVIDER_SITE_OTHER): Payer: Medicaid Other | Admitting: Certified Nurse Midwife

## 2017-01-25 ENCOUNTER — Encounter: Payer: Self-pay | Admitting: Certified Nurse Midwife

## 2017-01-25 VITALS — BP 120/79 | HR 90 | Wt 196.0 lb

## 2017-01-25 DIAGNOSIS — Z283 Underimmunization status: Secondary | ICD-10-CM | POA: Diagnosis not present

## 2017-01-25 DIAGNOSIS — O24410 Gestational diabetes mellitus in pregnancy, diet controlled: Secondary | ICD-10-CM

## 2017-01-25 DIAGNOSIS — O09899 Supervision of other high risk pregnancies, unspecified trimester: Secondary | ICD-10-CM

## 2017-01-25 DIAGNOSIS — Z2839 Other underimmunization status: Secondary | ICD-10-CM

## 2017-01-25 DIAGNOSIS — O0993 Supervision of high risk pregnancy, unspecified, third trimester: Secondary | ICD-10-CM

## 2017-01-25 MED ORDER — ACCU-CHEK GUIDE W/DEVICE KIT: 1.0000 | PACK | Freq: Once | 0 refills | Status: AC

## 2017-01-25 MED ORDER — ACCU-CHEK MULTICLIX LANCETS MISC: 5 refills | Status: DC

## 2017-01-25 MED ORDER — GLUCOSE BLOOD VI STRP: ORAL_STRIP | 5 refills | Status: DC

## 2017-01-25 NOTE — Progress Notes (Signed)
   PRENATAL VISIT NOTE  Subjective:  Vanessa Marks is a 36 y.o. [id number] at 34w0dbeing seen today for ongoing prenatal care.  She is currently monitored for the following issues for this high-risk pregnancy and has Supervision of high-risk pregnancy; Susceptible to varicella (non-immune), currently pregnant; Abnormal MSAFP (maternal serum alpha-fetoprotein), decreased; and Gestational diabetes mellitus, antepartum on her problem list.  Patient reports no complaints.  Contractions: Irregular. Vag. Bleeding: None.  Movement: Present. Denies leaking of fluid.   The following portions of the patient's history were reviewed and updated as appropriate: allergies, current medications, past family history, past medical history, past social history, past surgical history and problem list. Problem list updated.  Objective:   Vitals:   01/25/17 0826  BP: 120/79  Pulse: 90  Weight: 196 lb (88.9 kg)    Fetal Status: Fetal Heart Rate (bpm): 145 Fundal Height: 30 cm Movement: Present     General:  Alert, oriented and cooperative. Patient is in no acute distress.  Skin: Skin is warm and dry. No rash noted.   Cardiovascular: Normal heart rate noted  Respiratory: Normal respiratory effort, no problems with respiration noted  Abdomen: Soft, gravid, appropriate for gestational age. Pain/Pressure: Present     Pelvic:  Cervical exam deferred        Extremities: Normal range of motion.  Edema: None  Mental Status: Normal mood and affect. Normal behavior. Normal judgment and thought content.   Assessment and Plan:  Pregnancy: [id number] 359w0d1. Supervision of high risk pregnancy in third trimester     Doing well has DM teaching for 01/31/17.   - [place]; Future - Amb Referral to Nutrition and Diabetic E  2. Susceptible to varicella (non-immune), currently pregnant     Varicella postpartum  3. Diet controlled gestational diabetes mellitus (GDM), antepartum     - [place] OB FOLLOW [place];  Future - Amb Referral to Nutrition and Diabetic E - Blood Glucose Monitoring Suppl (ACCU-CHEK GUIDE) w/Device KIT; 1 kit by Does not apply route once.  Dispense: 1 kit; Refill: 0 - glucose blood (ACCU-CHEK GUIDE) test strip; Use one test strip to check blood glucose 4 times daily  Dispense: 100 each; Refill: 5 - Lancets (ACCU-CHEK MULTICLIX) lancets; Use lancet to check blood glucose 4 times daily  Dispense: 100 each; Refill: 5  Preterm labor symptoms and general obstetric precautions including but not limited to vaginal bleeding, contractions, leaking of fluid and fetal movement were reviewed in detail with the patient. Please refer to After Visit Summary for other counseling recommendations.  [facility] in about 2 weeks (around 02/08/2017) [facility]  RaMorene CrockerCNM.

## 2017-01-31 ENCOUNTER — Encounter: Payer: Medicaid Other | Attending: Certified Nurse Midwife | Admitting: Skilled Nursing Facility1

## 2017-01-31 DIAGNOSIS — O24419 Gestational diabetes mellitus in pregnancy, unspecified control: Secondary | ICD-10-CM | POA: Insufficient documentation

## 2017-01-31 DIAGNOSIS — Z713 Dietary counseling and surveillance: Secondary | ICD-10-CM | POA: Insufficient documentation

## 2017-01-31 DIAGNOSIS — Z3A00 Weeks of gestation of pregnancy not specified: Secondary | ICD-10-CM | POA: Diagnosis not present

## 2017-01-31 DIAGNOSIS — O24410 Gestational diabetes mellitus in pregnancy, diet controlled: Secondary | ICD-10-CM

## 2017-02-01 ENCOUNTER — Encounter: Payer: Self-pay | Admitting: Skilled Nursing Facility1

## 2017-02-01 NOTE — Progress Notes (Signed)
  Patient was seen on 01/31/2017 for Gestational Diabetes self-management class at the [facility]. The following learning objectives were met by the patient during this course:   States the definition of Gestational Diabetes  States why dietary management is important in controlling blood glucose  Describes the effects each nutrient has on blood glucose levels  Demonstrates ability to create a balanced meal plan  Demonstrates carbohydrate counting   States when to check blood glucose levels involving a total of 4 separate occurences in a day  Demonstrates proper blood glucose monitoring techniques  States the effect of stress and exercise on blood glucose levels  States the importance of limiting caffeine and abstaining from alcohol and smoking  Demonstrates the knowledge the glucometer provided in class may not be covered by their insurance and to call their insurance provider immediately after class to know which glucometer their insurance provider does cover as well as calling their physician the next day for a prescription to the glucometer their insurance does cover (if the one provided is not) as well as the lancets and strips for that meter.  Blood glucose monitor given: pt already had her own Blood glucose reading: 115 fasting  Patient instructed to monitor glucose levels: FBS: 60 - <90 1 hour: <140 2 hour: <120  *Patient received handouts:  Nutrition Diabetes and Pregnancy  Carbohydrate Counting List  Patient will be seen for follow-up as needed.

## 2017-02-08 ENCOUNTER — Encounter (HOSPITAL_COMMUNITY): Payer: Self-pay

## 2017-02-08 ENCOUNTER — Ambulatory Visit (HOSPITAL_COMMUNITY)
Admission: RE | Admit: 2017-02-08 | Discharge: 2017-02-08 | Disposition: A | Discharge status: Home / Self Care | Payer: Medicaid Other | Source: Ambulatory Visit | Attending: Certified Nurse Midwife | Admitting: Certified Nurse Midwife

## 2017-02-08 DIAGNOSIS — O0993 Supervision of high risk pregnancy, unspecified, third trimester: Secondary | ICD-10-CM | POA: Diagnosis present

## 2017-02-08 DIAGNOSIS — Z3A32 32 weeks gestation of pregnancy: Secondary | ICD-10-CM | POA: Diagnosis not present

## 2017-02-08 DIAGNOSIS — Z3689 Encounter for other specified antenatal screening: Secondary | ICD-10-CM | POA: Insufficient documentation

## 2017-02-08 DIAGNOSIS — O24410 Gestational diabetes mellitus in pregnancy, diet controlled: Secondary | ICD-10-CM | POA: Insufficient documentation

## 2017-02-09 ENCOUNTER — Other Ambulatory Visit (HOSPITAL_COMMUNITY): Payer: Self-pay | Admitting: *Deleted

## 2017-02-09 DIAGNOSIS — O24410 Gestational diabetes mellitus in pregnancy, diet controlled: Secondary | ICD-10-CM

## 2017-02-10 ENCOUNTER — Other Ambulatory Visit: Payer: Self-pay | Admitting: Certified Nurse Midwife

## 2017-02-10 DIAGNOSIS — O3660X0 Maternal care for excessive fetal growth, unspecified trimester, not applicable or unspecified: Secondary | ICD-10-CM | POA: Insufficient documentation

## 2017-02-10 DIAGNOSIS — O3663X0 Maternal care for excessive fetal growth, third trimester, not applicable or unspecified: Secondary | ICD-10-CM

## 2017-02-14 ENCOUNTER — Ambulatory Visit (INDEPENDENT_AMBULATORY_CARE_PROVIDER_SITE_OTHER): Payer: Medicaid Other | Admitting: Obstetrics and Gynecology

## 2017-02-14 VITALS — BP 128/74 | HR 91 | Wt 196.0 lb

## 2017-02-14 DIAGNOSIS — O280 Abnormal hematological finding on antenatal screening of mother: Secondary | ICD-10-CM | POA: Diagnosis not present

## 2017-02-14 DIAGNOSIS — O0993 Supervision of high risk pregnancy, unspecified, third trimester: Secondary | ICD-10-CM

## 2017-02-14 DIAGNOSIS — Z283 Underimmunization status: Secondary | ICD-10-CM | POA: Diagnosis not present

## 2017-02-14 DIAGNOSIS — Z2839 Other underimmunization status: Secondary | ICD-10-CM

## 2017-02-14 DIAGNOSIS — O09899 Supervision of other high risk pregnancies, unspecified trimester: Secondary | ICD-10-CM

## 2017-02-14 DIAGNOSIS — O24410 Gestational diabetes mellitus in pregnancy, diet controlled: Secondary | ICD-10-CM | POA: Diagnosis not present

## 2017-02-14 NOTE — Patient Instructions (Signed)
Gestational Diabetes Mellitus, Diagnosis Gestational diabetes (gestational diabetes mellitus) is a temporary form of diabetes that some women develop during pregnancy. It usually occurs around weeks 24-28 of pregnancy and goes away after delivery. Hormonal changes during pregnancy can interfere with insulin production and function, which may result in one or both of these problems:  The pancreas does not make enough of a hormone called insulin.  Cells in the body do not respond properly to insulin that the body makes (insulin resistance).  Normally, insulin allows sugars (glucose) to enter cells in the body. The cells use glucose for energy. Insulin resistance or lack of insulin causes excess glucose to build up in the blood instead of going into cells. As a result, high blood glucose (hyperglycemia) develops. What are the risks? If gestational diabetes is treated, it is unlikely to cause problems. If it is not controlled with treatment, it may cause problems during labor and delivery, and some of those problems can be harmful to the unborn baby (fetus) and the mother. Uncontrolled gestational diabetes may also cause the newborn baby to have breathing problems and low blood glucose. Women who get gestational diabetes are more likely to develop it if they get pregnant again, and they are more likely to develop type 2 diabetes in the future. What increases the risk? This condition may be more likely to develop in pregnant women who:  Are older than age 25 during pregnancy.  Have a family history of diabetes.  Are overweight.  Had gestational diabetes in the past.  Have polycystic ovarian syndrome (PCOS).  Are pregnant with twins or multiples.  Are of American-Indian, African-American, Hispanic/Latino, or Asian/Pacific Islander descent.  What are the signs or symptoms? Most women do not notice symptoms of gestational diabetes because the symptoms are similar to normal symptoms of pregnancy.  Symptoms of gestational diabetes may include:  Increased thirst (polydipsia).  Increased hunger(polyphagia).  Increased urination (polyuria).  How is this diagnosed?  This condition may be diagnosed based on your blood glucose level, which may be checked with one or more of the following blood tests:  A fasting blood glucose (FBG) test. You will not be allowed to eat (you will fast) for at least 8 hours before a blood sample is taken.  A random blood glucose test. This checks your blood glucose at any time of day regardless of when you ate.  An oral glucose tolerance test (OGTT). This is usually done during weeks 24-28 of pregnancy. ? For this test, you will have an FBG test done. Then, you will drink a beverage that contains glucose. Your blood glucose will be tested again 1 hour after drinking the glucose beverage (1-hour OGTT). ? If the 1-hour OGTT result is at or above 140 mg/dL (7.8 mmol/L), you will repeat the OGTT. This time, your blood glucose will be tested 3 hours after drinking the glucose beverage (3-hour OGTT).  If you have risk factors, you may be screened for undiagnosed type 2 diabetes at your first health care visit during your pregnancy (prenatal visit). How is this treated?  Your treatment may be managed by a specialist called an endocrinologist. This condition is treated by following instructions from your health care provider about:  Eating a healthier diet and getting more physical activity. These changes are the most important ways to manage gestational diabetes.  Checking your blood glucose. Do this as often as told.  Taking diabetes medicines or insulin every day. These will only be prescribed if they are   needed. ? If you use insulin, you may need to adjust your dosage based on how physically active you are and what foods you eat. Your health care provider will tell you how to do this.  Your health care provider will set treatment goals for you based on the  stage of your pregnancy and any other medical conditions you have. Generally, the goal of treatment is to maintain the following blood glucose levels during pregnancy:  Fasting: at or below 95 mg/dL (5.3 mmol/L).  After meals (postprandial): ? One hour after a meal: at or below 140 mg/dL (7.8 mmol/L). ? Two hours after a meal: at or below 120 mg/dL (6.7 mmol/L).  A1c (hemoglobin A1c) level: 6-6.5%.  Follow these instructions at home:  Take over-the-counter and prescription medicines only as told by your health care provider.  Manage your weight gain during pregnancy. The amount of weight that you are expected to gain depends on your pre-pregnancy BMI (body mass index).  Keep all follow-up visits as told by your health care provider. This is important. Consider asking your health care provider these questions:   Do I need to meet with a diabetes educator?  Where can I find a support group for people with diabetes?  What equipment will I need to manage my diabetes at home?  What diabetes medicines do I need, and when should I take them?  How often do I need to check my blood glucose?  What number can I call if I have questions?  When is my next appointment? Where to find more information:  For more information about diabetes, visit: ? American Diabetes Association (ADA): www.diabetes.org ? American Association of Diabetes Educators (AADE): www.diabeteseducator.org/patient-resources Contact a health care provider if:  Your blood glucose level is at or above 240 mg/dL (13.3 mmol/L).  Your blood glucose level is at or above 200 mg/dL (11.1 mmol/L) and you have ketones in your urine.  You have been sick or have had a fever for 2 days or more and you are not getting better.  You have any of the following problems for more than 6 hours: ? You cannot eat or drink. ? You have nausea and vomiting. ? You have diarrhea. Get help right away if:  Your blood glucose is below 54  mg/dL (3 mmol/L).  You become confused or you have trouble thinking clearly.  You have difficulty breathing.  You have moderate or large ketone levels in your urine.  Your baby is moving around less than usual.  You develop unusual discharge or bleeding from your vagina.  You start having contractions early (prematurely). Contractions may feel like a tightening in your lower abdomen. This information is not intended to replace advice given to you by your health care provider. Make sure you discuss any questions you have with your health care provider. Document Released: 01/31/2001 Document Revised: 04/01/2016 Document Reviewed: 11/28/2015 Elsevier Interactive Patient Education  2017 Elsevier Inc.  

## 2017-02-14 NOTE — Progress Notes (Signed)
   PRENATAL VISIT NOTE  Subjective:  Vanessa Marks is a 36 y.o. [id number] at [redacted]w[redacted]d being seen today for ongoing prenatal care.  She is currently monitored for the following issues for this high-risk pregnancy and has Supervision of high-risk pregnancy; Susceptible to varicella (non-immune), currently pregnant; Abnormal MSAFP (maternal serum alpha-fetoprotein), decreased; Gestational diabetes mellitus, antepartum; and LGA (large for gestational age) fetus affecting management of mother on her problem list.  Patient reports no complaints.  Contractions: Irregular. Vag. Bleeding: None.  Movement: Present. Denies leaking of fluid.   The following portions of the patient's history were reviewed and updated as appropriate: allergies, current medications, past family history, past medical history, past social history, past surgical history and problem list. Problem list updated.  Objective:   Vitals:   02/14/17 1038  BP: 128/74  Pulse: 91  Weight: 196 lb (88.9 kg)    Fetal Status: Fetal Heart Rate (bpm): 130 Fundal Height: 33 cm Movement: Present     General:  Alert, oriented and cooperative. Patient is in no acute distress.  Skin: Skin is warm and dry. No rash noted.   Cardiovascular: Normal heart rate noted  Respiratory: Normal respiratory effort, no problems with respiration noted  Abdomen: Soft, gravid, appropriate for gestational age. Pain/Pressure: Present     Pelvic:  Cervical exam deferred        Extremities: Normal range of motion.  Edema: None  Mental Status: Normal mood and affect. Normal behavior. Normal judgment and thought content.   Assessment and Plan:  Pregnancy: [id number] at [redacted]w[redacted]d  1. Supervision of high risk pregnancy in third trimester Patient is doing well without complaints Declined NIPS  2. Diet controlled gestational diabetes mellitus (GDM), antepartum Patient did not bring CBG log she reports fasting in 90's and pp 120-170 Patient to return next week for CBG log  review to determine need for medication initiation  3. Susceptible to varicella (non-immune), currently pregnant Will offer pp  Preterm labor symptoms and general obstetric precautions including but not limited to vaginal bleeding, contractions, leaking of fluid and fetal movement were reviewed in detail with the patient. Please refer to After Visit Summary for other counseling recommendations.  Return in about 1 week (around 02/21/2017) for ROB.   Catalina Antigua, MD

## 2017-02-14 NOTE — Progress Notes (Signed)
Pt presents for ROB. CBG log unavailable today; pt states she did not know to bring CBG log to every visit.

## 2017-02-21 ENCOUNTER — Ambulatory Visit (INDEPENDENT_AMBULATORY_CARE_PROVIDER_SITE_OTHER): Payer: Medicaid Other | Admitting: Obstetrics and Gynecology

## 2017-02-21 VITALS — BP 112/75 | HR 93 | Wt 196.0 lb

## 2017-02-21 DIAGNOSIS — O0993 Supervision of high risk pregnancy, unspecified, third trimester: Secondary | ICD-10-CM

## 2017-02-21 DIAGNOSIS — O09899 Supervision of other high risk pregnancies, unspecified trimester: Secondary | ICD-10-CM

## 2017-02-21 DIAGNOSIS — O280 Abnormal hematological finding on antenatal screening of mother: Secondary | ICD-10-CM | POA: Diagnosis not present

## 2017-02-21 DIAGNOSIS — O24410 Gestational diabetes mellitus in pregnancy, diet controlled: Secondary | ICD-10-CM

## 2017-02-21 DIAGNOSIS — Z283 Underimmunization status: Secondary | ICD-10-CM

## 2017-02-21 DIAGNOSIS — Z2839 Other underimmunization status: Secondary | ICD-10-CM

## 2017-02-21 NOTE — Progress Notes (Signed)
   PRENATAL VISIT NOTE  Subjective:  Vanessa Marks is a 36 y.o. [id number] at [redacted]w[redacted]d being seen today for ongoing prenatal care.  She is currently monitored for the following issues for this high-risk pregnancy and has Supervision of high-risk pregnancy; Susceptible to varicella (non-immune), currently pregnant; Abnormal MSAFP (maternal serum alpha-fetoprotein), decreased; Gestational diabetes mellitus, antepartum; and LGA (large for gestational age) fetus affecting management of mother on her problem list.  Patient reports no complaints.  Contractions: Not present. Vag. Bleeding: None.  Movement: Present. Denies leaking of fluid.   The following portions of the patient's history were reviewed and updated as appropriate: allergies, current medications, past family history, past medical history, past social history, past surgical history and problem list. Problem list updated.  Objective:   Vitals:   02/21/17 0951  BP: 112/75  Pulse: 93  Weight: 196 lb (88.9 kg)    Fetal Status: Fetal Heart Rate (bpm): 130 Fundal Height: 34 cm Movement: Present     General:  Alert, oriented and cooperative. Patient is in no acute distress.  Skin: Skin is warm and dry. No rash noted.   Cardiovascular: Normal heart rate noted  Respiratory: Normal respiratory effort, no problems with respiration noted  Abdomen: Soft, gravid, appropriate for gestational age. Pain/Pressure: Absent     Pelvic:  Cervical exam deferred        Extremities: Normal range of motion.  Edema: None  Mental Status: Normal mood and affect. Normal behavior. Normal judgment and thought content.   Assessment and Plan:  Pregnancy: [id number] at [redacted]w[redacted]d  1. Supervision of high risk pregnancy in third trimester Patient is doing well without complaints  2. Diet controlled gestational diabetes mellitus (GDM), antepartum CBGs reviewed- fasting in 90's as high as 99 and pp great majority within range with highest value of 136 Patient eating cereal  as a bedtime snack- reviewed better options with the patient Continue diet control Follow up growth ultrasound on May 1  3. Abnormal MSAFP (maternal serum alpha-fetoprotein), decreased Declines further testing  4. Susceptible to varicella (non-immune), currently pregnant Will offer pp  Preterm labor symptoms and general obstetric precautions including but not limited to vaginal bleeding, contractions, leaking of fluid and fetal movement were reviewed in detail with the patient. Please refer to After Visit Summary for other counseling recommendations.  Return in about 2 weeks (around 03/07/2017) for ROB.   Catalina Antigua, MD

## 2017-02-21 NOTE — Progress Notes (Signed)
Patient reports good fetal movement, denies pain. 

## 2017-03-08 ENCOUNTER — Encounter (HOSPITAL_COMMUNITY): Payer: Self-pay

## 2017-03-08 ENCOUNTER — Encounter: Payer: Medicaid Other | Admitting: Obstetrics and Gynecology

## 2017-03-08 ENCOUNTER — Ambulatory Visit (INDEPENDENT_AMBULATORY_CARE_PROVIDER_SITE_OTHER): Payer: Medicaid Other | Admitting: Obstetrics and Gynecology

## 2017-03-08 ENCOUNTER — Other Ambulatory Visit: Payer: Self-pay | Admitting: Certified Nurse Midwife

## 2017-03-08 ENCOUNTER — Other Ambulatory Visit (HOSPITAL_COMMUNITY)
Admission: RE | Admit: 2017-03-08 | Discharge: 2017-03-08 | Disposition: A | Discharge status: Home / Self Care | Payer: Medicaid Other | Source: Ambulatory Visit | Attending: Obstetrics and Gynecology | Admitting: Obstetrics and Gynecology

## 2017-03-08 ENCOUNTER — Ambulatory Visit (HOSPITAL_COMMUNITY)
Admission: RE | Admit: 2017-03-08 | Discharge: 2017-03-08 | Disposition: A | Discharge status: Home / Self Care | Payer: Medicaid Other | Source: Ambulatory Visit | Attending: Certified Nurse Midwife | Admitting: Certified Nurse Midwife

## 2017-03-08 VITALS — BP 143/80 | HR 94 | Wt 199.0 lb

## 2017-03-08 DIAGNOSIS — O0990 Supervision of high risk pregnancy, unspecified, unspecified trimester: Secondary | ICD-10-CM

## 2017-03-08 DIAGNOSIS — Z3A36 36 weeks gestation of pregnancy: Secondary | ICD-10-CM | POA: Insufficient documentation

## 2017-03-08 DIAGNOSIS — O24410 Gestational diabetes mellitus in pregnancy, diet controlled: Secondary | ICD-10-CM | POA: Insufficient documentation

## 2017-03-08 DIAGNOSIS — O280 Abnormal hematological finding on antenatal screening of mother: Secondary | ICD-10-CM | POA: Diagnosis not present

## 2017-03-08 DIAGNOSIS — O09893 Supervision of other high risk pregnancies, third trimester: Secondary | ICD-10-CM | POA: Diagnosis not present

## 2017-03-08 DIAGNOSIS — O09899 Supervision of other high risk pregnancies, unspecified trimester: Secondary | ICD-10-CM

## 2017-03-08 DIAGNOSIS — O0993 Supervision of high risk pregnancy, unspecified, third trimester: Secondary | ICD-10-CM | POA: Diagnosis not present

## 2017-03-08 DIAGNOSIS — O285 Abnormal chromosomal and genetic finding on antenatal screening of mother: Secondary | ICD-10-CM | POA: Insufficient documentation

## 2017-03-08 DIAGNOSIS — Z283 Underimmunization status: Secondary | ICD-10-CM

## 2017-03-08 NOTE — Progress Notes (Signed)
Subjective:  Solae Norling is a 36 y.o. [id number] at [redacted]w[redacted]d being seen today for ongoing prenatal care.  She is currently monitored for the following issues for this high-risk pregnancy and has Supervision of high-risk pregnancy; Susceptible to varicella (non-immune), currently pregnant; Abnormal MSAFP (maternal serum alpha-fetoprotein), decreased; Gestational diabetes mellitus, antepartum; and LGA (large for gestational age) fetus affecting management of mother on her problem list.  Patient reports no complaints.  Contractions: Not present. Vag. Bleeding: None.  Movement: Present. Denies leaking of fluid.   The following portions of the patient's history were reviewed and updated as appropriate: allergies, current medications, past family history, past medical history, past social history, past surgical history and problem list. Problem list updated.  Objective:   Vitals:   03/08/17 0957  BP: (!) 143/80  Pulse: 94  Weight: 90.3 kg (199 lb)    Fetal Status: Fetal Heart Rate (bpm): 150   Movement: Present     General:  Alert, oriented and cooperative. Patient is in no acute distress.  Skin: Skin is warm and dry. No rash noted.   Cardiovascular: Normal heart rate noted  Respiratory: Normal respiratory effort, no problems with respiration noted  Abdomen: Soft, gravid, appropriate for gestational age. Pain/Pressure: Absent     Pelvic:  Cervical exam performed        Extremities: Normal range of motion.  Edema: None  Mental Status: Normal mood and affect. Normal behavior. Normal judgment and thought content.   Urinalysis:      Assessment and Plan:  Pregnancy: [id number] at [redacted]w[redacted]d  1. Supervision of high risk pregnancy, antepartum Stable - Strep Gp B NAA - GC/Chlamydia probe amp ([place])not at [facility]  2. Diet controlled gestational diabetes mellitus (GDM), antepartum Growth scan today. BS in goal range for the most part. Elevated ones related to diet choices  3. Susceptible to  varicella (non-immune), currently pregnant PP vaccine  4. Abnormal MSAFP (maternal serum alpha-fetoprotein), decreased Declined further testing   Preterm labor symptoms and general obstetric precautions including but not limited to vaginal bleeding, contractions, leaking of fluid and fetal movement were reviewed in detail with the patient. Please refer to After Visit Summary for other counseling recommendations.  Return in about 1 week (around 03/15/2017) for OB visit.   Hermina Staggers, MD

## 2017-03-08 NOTE — Progress Notes (Signed)
Patient states she is doing well with her diet and her glucose levels.

## 2017-03-09 LAB — GC/CHLAMYDIA PROBE AMP (~~LOC~~) NOT AT ARMC
Chlamydia: NEGATIVE
Neisseria Gonorrhea: NEGATIVE

## 2017-03-09 LAB — OB RESULTS CONSOLE GBS: GBS: NEGATIVE

## 2017-03-09 LAB — OB RESULTS CONSOLE GC/CHLAMYDIA
Chlamydia: NEGATIVE
Gonorrhea: NEGATIVE

## 2017-03-10 LAB — STREP GP B NAA: STREP GROUP B AG: NEGATIVE

## 2017-03-15 ENCOUNTER — Ambulatory Visit (INDEPENDENT_AMBULATORY_CARE_PROVIDER_SITE_OTHER): Payer: Medicaid Other | Admitting: Obstetrics and Gynecology

## 2017-03-15 VITALS — BP 127/76 | HR 87 | Wt 202.7 lb

## 2017-03-15 DIAGNOSIS — O24410 Gestational diabetes mellitus in pregnancy, diet controlled: Secondary | ICD-10-CM | POA: Diagnosis not present

## 2017-03-15 DIAGNOSIS — O09899 Supervision of other high risk pregnancies, unspecified trimester: Secondary | ICD-10-CM

## 2017-03-15 DIAGNOSIS — O3663X0 Maternal care for excessive fetal growth, third trimester, not applicable or unspecified: Secondary | ICD-10-CM

## 2017-03-15 DIAGNOSIS — O0993 Supervision of high risk pregnancy, unspecified, third trimester: Secondary | ICD-10-CM

## 2017-03-15 DIAGNOSIS — Z283 Underimmunization status: Secondary | ICD-10-CM

## 2017-03-15 DIAGNOSIS — O280 Abnormal hematological finding on antenatal screening of mother: Secondary | ICD-10-CM

## 2017-03-15 NOTE — Progress Notes (Signed)
   PRENATAL VISIT NOTE  Subjective:  Vanessa Marks is a 36 y.o. [id number] at 351w0d being seen today for ongoing prenatal care.  She is currently monitored for the following issues for this high-risk pregnancy and has Supervision of high-risk pregnancy; Susceptible to varicella (non-immune), currently pregnant; Abnormal MSAFP (maternal serum alpha-fetoprotein), decreased; Gestational diabetes mellitus, antepartum; and LGA (large for gestational age) fetus affecting management of mother on her problem list.  Patient reports no complaints.  Contractions: Irritability. Vag. Bleeding: None.  Movement: Present. Denies leaking of fluid.   The following portions of the patient's history were reviewed and updated as appropriate: allergies, current medications, past family history, past medical history, past social history, past surgical history and problem list. Problem list updated.  Objective:   Vitals:   03/15/17 0918  BP: 127/76  Pulse: 87  Weight: 202 lb 11.2 oz (91.9 kg)    Fetal Status: Fetal Heart Rate (bpm): 139 (Simultaneous filing. User may not have seen previous data.) Fundal Height: 37 cm Movement: Present     General:  Alert, oriented and cooperative. Patient is in no acute distress.  Skin: Skin is warm and dry. No rash noted.   Cardiovascular: Normal heart rate noted  Respiratory: Normal respiratory effort, no problems with respiration noted  Abdomen: Soft, gravid, appropriate for gestational age. Pain/Pressure: Present     Pelvic:  Cervical exam deferred        Extremities: Normal range of motion.  Edema: Trace  Mental Status: Normal mood and affect. Normal behavior. Normal judgment and thought content.   Assessment and Plan:  Pregnancy: [name] at 5651w0d  1. Supervision of high risk pregnancy in third trimester Patient is doing well reporting some left wrist and finger numbness- Discussed the diagnosis of carpal tunnel syndrome and the benefits of using a wrist brace   2.  Excessive fetal growth affecting management of pregnancy in third trimester, single or unspecified fetus Ultrasound reviewed with the patient 5/1 EFW 2749 gm (57%tile)  3. Diet controlled gestational diabetes mellitus (GDM), antepartum CBGS reviewed and all values within range except after dinner with values as high as 130. Patient admits to over eating Reviewed importance of adhering to diet in these last few weeks of pregnancy  4. Susceptible to varicella (non-immune), currently pregnant Will offer pp  5. Abnormal MSAFP (maternal serum alpha-fetoprotein), decreased 1:50 DSR on quad screen. Patient declined NIPS  Term labor symptoms and general obstetric precautions including but not limited to vaginal bleeding, contractions, leaking of fluid and fetal movement were reviewed in detail with the patient. Please refer to After Visit Summary for other counseling recommendations.  Return in about 1 week (around 03/22/2017) for ROB.   Kannon Baum, Gigi GinPeggy, MD

## 2017-03-21 ENCOUNTER — Inpatient Hospital Stay (HOSPITAL_COMMUNITY): Payer: Medicaid Other | Admitting: Anesthesiology

## 2017-03-21 ENCOUNTER — Ambulatory Visit (INDEPENDENT_AMBULATORY_CARE_PROVIDER_SITE_OTHER): Payer: Medicaid Other | Admitting: Obstetrics and Gynecology

## 2017-03-21 ENCOUNTER — Inpatient Hospital Stay (HOSPITAL_COMMUNITY)
Admission: AD | Admit: 2017-03-21 | Discharge: 2017-03-24 | DRG: 774 | Disposition: A | Discharge status: Home / Self Care | Payer: Medicaid Other | Source: Ambulatory Visit | Attending: Obstetrics and Gynecology | Admitting: Obstetrics and Gynecology

## 2017-03-21 VITALS — BP 150/83 | HR 102 | Wt 202.6 lb

## 2017-03-21 DIAGNOSIS — O9952 Diseases of the respiratory system complicating childbirth: Secondary | ICD-10-CM | POA: Diagnosis present

## 2017-03-21 DIAGNOSIS — O9081 Anemia of the puerperium: Secondary | ICD-10-CM | POA: Diagnosis not present

## 2017-03-21 DIAGNOSIS — Z3490 Encounter for supervision of normal pregnancy, unspecified, unspecified trimester: Secondary | ICD-10-CM | POA: Diagnosis present

## 2017-03-21 DIAGNOSIS — O3663X0 Maternal care for excessive fetal growth, third trimester, not applicable or unspecified: Secondary | ICD-10-CM | POA: Diagnosis present

## 2017-03-21 DIAGNOSIS — Z283 Underimmunization status: Secondary | ICD-10-CM | POA: Diagnosis not present

## 2017-03-21 DIAGNOSIS — O280 Abnormal hematological finding on antenatal screening of mother: Secondary | ICD-10-CM

## 2017-03-21 DIAGNOSIS — Z3A38 38 weeks gestation of pregnancy: Secondary | ICD-10-CM | POA: Diagnosis not present

## 2017-03-21 DIAGNOSIS — O24410 Gestational diabetes mellitus in pregnancy, diet controlled: Secondary | ICD-10-CM | POA: Diagnosis not present

## 2017-03-21 DIAGNOSIS — O134 Gestational [pregnancy-induced] hypertension without significant proteinuria, complicating childbirth: Secondary | ICD-10-CM | POA: Diagnosis present

## 2017-03-21 DIAGNOSIS — Z3A37 37 weeks gestation of pregnancy: Secondary | ICD-10-CM

## 2017-03-21 DIAGNOSIS — O24420 Gestational diabetes mellitus in childbirth, diet controlled: Secondary | ICD-10-CM | POA: Diagnosis present

## 2017-03-21 DIAGNOSIS — O09899 Supervision of other high risk pregnancies, unspecified trimester: Secondary | ICD-10-CM

## 2017-03-21 DIAGNOSIS — O0993 Supervision of high risk pregnancy, unspecified, third trimester: Secondary | ICD-10-CM

## 2017-03-21 DIAGNOSIS — J45909 Unspecified asthma, uncomplicated: Secondary | ICD-10-CM | POA: Diagnosis present

## 2017-03-21 LAB — CBC
HCT: 33.9 % — ABNORMAL LOW (ref 36.0–46.0)
Hemoglobin: 11.2 g/dL — ABNORMAL LOW (ref 12.0–15.0)
MCH: 26.6 pg (ref 26.0–34.0)
MCHC: 33 g/dL (ref 30.0–36.0)
MCV: 80.5 fL (ref 78.0–100.0)
Platelets: 156 10*3/uL (ref 150–400)
RBC: 4.21 MIL/uL (ref 3.87–5.11)
RDW: 14.6 % (ref 11.5–15.5)
WBC: 7.6 10*3/uL (ref 4.0–10.5)

## 2017-03-21 LAB — COMPREHENSIVE METABOLIC PANEL
ALT: 30 U/L (ref 14–54)
AST: 32 U/L (ref 15–41)
Albumin: 2.8 g/dL — ABNORMAL LOW (ref 3.5–5.0)
Alkaline Phosphatase: 139 U/L — ABNORMAL HIGH (ref 38–126)
Anion gap: 7 (ref 5–15)
BUN: 10 mg/dL (ref 6–20)
CALCIUM: 9.4 mg/dL (ref 8.9–10.3)
CO2: 20 mmol/L — AB (ref 22–32)
Chloride: 107 mmol/L (ref 101–111)
Creatinine, Ser: 0.33 mg/dL — ABNORMAL LOW (ref 0.44–1.00)
Glucose, Bld: 95 mg/dL (ref 65–99)
Potassium: 4.1 mmol/L (ref 3.5–5.1)
SODIUM: 134 mmol/L — AB (ref 135–145)
Total Bilirubin: 0.3 mg/dL (ref 0.3–1.2)
Total Protein: 6.7 g/dL (ref 6.5–8.1)

## 2017-03-21 LAB — GLUCOSE, CAPILLARY
GLUCOSE-CAPILLARY: 78 mg/dL (ref 65–99)
Glucose-Capillary: 99 mg/dL (ref 65–99)
Glucose-Capillary: 116 mg/dL — ABNORMAL HIGH (ref 65–99)

## 2017-03-21 LAB — PROTEIN / CREATININE RATIO, URINE
Creatinine, Urine: 81 mg/dL
Protein Creatinine Ratio: 0.14 mg/mg{Cre} (ref 0.00–0.15)
TOTAL PROTEIN, URINE: 11 mg/dL

## 2017-03-21 LAB — ABO/RH: ABO/RH(D): A POS

## 2017-03-21 MED ORDER — EPHEDRINE 5 MG/ML INJ: 10.0000 mg | INTRAVENOUS | Status: DC | PRN

## 2017-03-21 MED ORDER — TERBUTALINE SULFATE 1 MG/ML IJ SOLN
0.2500 mg | Freq: Once | INTRAMUSCULAR | Status: DC | PRN
  Filled 2017-03-21: qty 1

## 2017-03-21 MED ORDER — PHENYLEPHRINE 40 MCG/ML (10ML) SYRINGE FOR IV PUSH (FOR BLOOD PRESSURE SUPPORT): 80.0000 ug | PREFILLED_SYRINGE | INTRAVENOUS | Status: DC | PRN

## 2017-03-21 MED ORDER — OXYCODONE-ACETAMINOPHEN 5-325 MG PO TABS: 2.0000 | ORAL_TABLET | ORAL | Status: DC | PRN

## 2017-03-21 MED ORDER — FENTANYL 2.5 MCG/ML BUPIVACAINE 1/10 % EPIDURAL INFUSION (WH - ANES): 14.0000 mL/h | INTRAMUSCULAR | Status: DC | PRN

## 2017-03-21 MED ORDER — FENTANYL CITRATE (PF) 100 MCG/2ML IJ SOLN: 50.0000 ug | INTRAMUSCULAR | Status: DC | PRN

## 2017-03-21 MED ORDER — PHENYLEPHRINE 40 MCG/ML (10ML) SYRINGE FOR IV PUSH (FOR BLOOD PRESSURE SUPPORT)
80.0000 ug | PREFILLED_SYRINGE | INTRAVENOUS | Status: DC | PRN
  Filled 2017-03-21: qty 10
  Filled 2017-03-21: qty 5

## 2017-03-21 MED ORDER — LACTATED RINGERS IV SOLN
500.0000 mL | INTRAVENOUS | Status: DC | PRN
  Administered 2017-03-21 – 2017-03-22 (×2): 500 mL via INTRAVENOUS

## 2017-03-21 MED ORDER — LIDOCAINE HCL (PF) 1 % IJ SOLN
INTRAMUSCULAR | Status: DC | PRN
  Administered 2017-03-21: 5 mL via EPIDURAL

## 2017-03-21 MED ORDER — PHENYLEPHRINE 40 MCG/ML (10ML) SYRINGE FOR IV PUSH (FOR BLOOD PRESSURE SUPPORT)
80.0000 ug | PREFILLED_SYRINGE | INTRAVENOUS | Status: DC | PRN
  Filled 2017-03-21: qty 5

## 2017-03-21 MED ORDER — DIPHENHYDRAMINE HCL 50 MG/ML IJ SOLN: 12.5000 mg | INTRAMUSCULAR | Status: DC | PRN

## 2017-03-21 MED ORDER — SOD CITRATE-CITRIC ACID 500-334 MG/5ML PO SOLN: 30.0000 mL | ORAL | Status: DC | PRN

## 2017-03-21 MED ORDER — OXYTOCIN 40 UNITS IN LACTATED RINGERS INFUSION - SIMPLE MED
1.0000 m[IU]/min | INTRAVENOUS | Status: DC
  Administered 2017-03-21: 2 m[IU]/min via INTRAVENOUS
  Filled 2017-03-21: qty 1000

## 2017-03-21 MED ORDER — LACTATED RINGERS IV SOLN: 500.0000 mL | Freq: Once | INTRAVENOUS | Status: DC

## 2017-03-21 MED ORDER — LIDOCAINE HCL (PF) 1 % IJ SOLN
30.0000 mL | INTRAMUSCULAR | Status: DC | PRN
  Filled 2017-03-21: qty 30

## 2017-03-21 MED ORDER — MISOPROSTOL 25 MCG QUARTER TABLET
25.0000 ug | ORAL_TABLET | ORAL | Status: DC | PRN
  Administered 2017-03-21: 25 ug via VAGINAL
  Filled 2017-03-21 (×2): qty 1

## 2017-03-21 MED ORDER — LACTATED RINGERS IV SOLN
500.0000 mL | Freq: Once | INTRAVENOUS | Status: AC
  Administered 2017-03-21: 500 mL via INTRAVENOUS

## 2017-03-21 MED ORDER — OXYCODONE-ACETAMINOPHEN 5-325 MG PO TABS
1.0000 | ORAL_TABLET | ORAL | Status: DC | PRN
  Administered 2017-03-22: 1 via ORAL
  Filled 2017-03-21: qty 1

## 2017-03-21 MED ORDER — OXYTOCIN BOLUS FROM INFUSION
500.0000 mL | Freq: Once | INTRAVENOUS | Status: AC
  Administered 2017-03-22: 500 mL via INTRAVENOUS

## 2017-03-21 MED ORDER — OXYTOCIN 40 UNITS IN LACTATED RINGERS INFUSION - SIMPLE MED
2.5000 [IU]/h | INTRAVENOUS | Status: DC
  Administered 2017-03-22: 2.5 [IU]/h via INTRAVENOUS

## 2017-03-21 MED ORDER — ONDANSETRON HCL 4 MG/2ML IJ SOLN: 4.0000 mg | Freq: Four times a day (QID) | INTRAMUSCULAR | Status: DC | PRN

## 2017-03-21 MED ORDER — EPHEDRINE 5 MG/ML INJ
10.0000 mg | INTRAVENOUS | Status: DC | PRN
  Filled 2017-03-21: qty 2
  Filled 2017-03-21: qty 4

## 2017-03-21 MED ORDER — ACETAMINOPHEN 325 MG PO TABS
650.0000 mg | ORAL_TABLET | ORAL | Status: DC | PRN
  Filled 2017-03-21 (×2): qty 2

## 2017-03-21 MED ORDER — LACTATED RINGERS IV SOLN
INTRAVENOUS | Status: DC
  Administered 2017-03-21: 125 mL/h via INTRAVENOUS
  Administered 2017-03-22: 02:00:00 via INTRAVENOUS

## 2017-03-21 MED ORDER — EPHEDRINE 5 MG/ML INJ
10.0000 mg | INTRAVENOUS | Status: DC | PRN
  Administered 2017-03-21: 10 mg via INTRAVENOUS
  Filled 2017-03-21: qty 2

## 2017-03-21 MED ORDER — FENTANYL 2.5 MCG/ML BUPIVACAINE 1/10 % EPIDURAL INFUSION (WH - ANES)
14.0000 mL/h | INTRAMUSCULAR | Status: DC | PRN
  Administered 2017-03-21 – 2017-03-22 (×2): 14 mL/h via EPIDURAL
  Filled 2017-03-21 (×2): qty 100

## 2017-03-21 NOTE — Anesthesia Pain Management Evaluation Note (Signed)
  [facility] Pain Management Visit Note  Patient: Vanessa Marks, 36 y.o., female  "Hello I am a [facility] of the anesthesia [facility] at [facility]. We have an anesthesia team available at all times to provide care throughout the hospital, including epidural management and anesthesia for C-section. I don't know your plan for the delivery whether it a natural birth, water birth, IV sedation, nitrous supplementation, doula or epidural, but we want to meet your pain goals."   1.Was your pain managed to your expectations on prior hospitalizations?   Yes   2.What is your expectation for pain management during this hospitalization?     Epidural  3.How can we help you reach that goal? unsure  Record the patient's initial score and the patient's pain goal.   Pain: 4  Pain Goal: 0 [facility] wants you to be able to say your pain was always managed very well.  Cephus ShellingBURGER,Richad Ramsay 03/21/2017

## 2017-03-21 NOTE — Anesthesia Procedure Notes (Signed)
Epidural Patient location during procedure: OB Start time: 03/21/2017 5:48 PM End time: 03/21/2017 5:58 PM  Staffing Anesthesiologist: Chaney MallingHODIERNE, Ninetta Adelstein Performed: anesthesiologist   Preanesthetic Checklist Completed: patient identified, site marked, pre-op evaluation, timeout performed, IV checked, risks and benefits discussed and monitors and equipment checked  Epidural Patient position: sitting Prep: DuraPrep Patient monitoring: heart rate, cardiac monitor, continuous pulse ox and blood pressure Approach: midline Location: L2-L3 Injection technique: LOR saline  Needle:  Needle type: Tuohy  Needle gauge: 17 G Needle length: 9 cm Needle insertion depth: 6 cm Catheter type: closed end flexible Catheter size: 19 Gauge Catheter at skin depth: 11 cm Test dose: negative and Other  Assessment Events: blood not aspirated, injection not painful, no injection resistance and negative IV test  Additional Notes Informed consent obtained prior to proceeding including risk of failure, 1% risk of PDPH, risk of minor discomfort and bruising.  Discussed rare but serious complications including epidural abscess, permanent nerve injury, epidural hematoma.  Discussed alternatives to epidural analgesia and patient desires to proceed.  Timeout performed pre-procedure verifying patient name, procedure, and platelet count.  Patient tolerated procedure well. Reason for block:procedure for pain

## 2017-03-21 NOTE — Progress Notes (Signed)
LABOR PROGRESS NOTE  Vanessa Marks is a 36 y.o. [id number] at 7834w6d  admitted for IOL in the setting of gHTN and GDM.  Subjective: Patient is feeling contractions now but is able to breath through them. Patient is not demanding any pain medications for now.  Objective: BP 124/60 (BP Location: Right Arm)   Pulse 81   Temp 99 F (37.2 C) (Oral)   Resp 16   Ht 5\' 4"  (1.626 m)   Wt 202 lb (91.6 kg)   LMP 06/29/2016   BMI 34.67 kg/m  or  Vitals:   03/21/17 1155 03/21/17 1254 03/21/17 1516 03/21/17 1625  BP: 139/72 124/65  124/60  Pulse: 82 70  81  Resp: 16 16 16 16   Temp: 99 F (37.2 C)     TempSrc: Oral     Weight: 202 lb (91.6 kg)     Height: 5\' 4"  (1.626 m)       Dilation: 1.5 Effacement (%): 60 Cervical Position: Posterior Station: -3 Presentation: Vertex Exam by:: Doloris HallJenny Middleton RN  Labs: Lab Results  Component Value Date   WBC 7.6 03/21/2017   HGB 11.2 (L) 03/21/2017   HCT 33.9 (L) 03/21/2017   MCV 80.5 03/21/2017   PLT 156 03/21/2017    Patient Active Problem List   Diagnosis Date Noted  . Encounter for induction of labor 03/21/2017  . LGA (large for gestational age) fetus affecting management of mother 02/10/2017  . Gestational diabetes mellitus, antepartum 01/14/2017  . Abnormal MSAFP (maternal serum alpha-fetoprotein), decreased 11/09/2016  . Susceptible to varicella (non-immune), currently pregnant 09/29/2016  . Supervision of high-risk pregnancy 09/22/2016    Assessment / Plan: 36 y.o. [name] at 4034w6d here for IOL 2/2 gHTN. Cytotec was placed about 4 hour ago, patient has some cervical change.  Labor: will augment with Pitocin Fetal Wellbeing: Cat 1 Pain Control:  No pain control at the moment Anticipated MOD:  SVD  Lovena NeighboursAbdoulaye Shawntell Dixson, MD 03/21/2017, 5:08 PM

## 2017-03-21 NOTE — Progress Notes (Signed)
LABOR PROGRESS NOTE  Brain HiltsSalma Lough is a 36 y.o. [id number] at 3958w6d  admitted for IOL s/p gHTN.  Subjective: Patient is doing well and is comfortable. She is not feeling any contractions at the time.  Objective: BP 124/65 (BP Location: Right Arm)   Pulse 70   Temp 99 F (37.2 C) (Oral)   Resp 16   Ht 5\' 4"  (1.626 m)   Wt 202 lb (91.6 kg)   LMP 06/29/2016   BMI 34.67 kg/m  or  Vitals:   03/21/17 1155 03/21/17 1254  BP: 139/72 124/65  Pulse: 82 70  Resp: 16 16  Temp: 99 F (37.2 C)   TempSrc: Oral   Weight: 202 lb (91.6 kg)   Height: 5\' 4"  (1.626 m)     Dilation: 1.5 Effacement (%): 60 Cervical Position: Posterior Station: -3 Presentation: Vertex Exam by:: Doloris HallJenny Middleton RN  Labs: Lab Results  Component Value Date   WBC 7.6 03/21/2017   HGB 11.2 (L) 03/21/2017   HCT 33.9 (L) 03/21/2017   MCV 80.5 03/21/2017   PLT 156 03/21/2017    Patient Active Problem List   Diagnosis Date Noted  . Encounter for induction of labor 03/21/2017  . LGA (large for gestational age) fetus affecting management of mother 02/10/2017  . Gestational diabetes mellitus, antepartum 01/14/2017  . Abnormal MSAFP (maternal serum alpha-fetoprotein), decreased 11/09/2016  . Susceptible to varicella (non-immune), currently pregnant 09/29/2016  . Supervision of high-risk pregnancy 09/22/2016    Assessment / Plan: 36 y.o. [name] at 9358w6d here for IOL s/p gHTN.   Labor: Started on cytotec, will recheck Fetal Wellbeing:  Cat 1 Pain Control:  IV pain meds, planning on epidural Anticipated MOD: Vaginal  Lovena NeighboursAbdoulaye Eladia Frame, MD 03/21/2017, 1:22 PM

## 2017-03-21 NOTE — Anesthesia Preprocedure Evaluation (Signed)
Anesthesia Evaluation  Patient identified by MRN, date of birth, ID band Patient awake    Reviewed: Allergy & Precautions, H&P , NPO status , Patient's Chart, lab work & pertinent test results  Airway Mallampati: II   Neck ROM: full    Dental   Pulmonary asthma ,    breath sounds clear to auscultation       Cardiovascular negative cardio ROS   Rhythm:regular Rate:Normal     Neuro/Psych    GI/Hepatic   Endo/Other  diabetes, Gestational  Renal/GU      Musculoskeletal   Abdominal   Peds  Hematology   Anesthesia Other Findings   Reproductive/Obstetrics (+) Pregnancy                             Anesthesia Physical Anesthesia Plan  ASA: II  Anesthesia Plan: Epidural   Post-op Pain Management:    Induction: Intravenous  Airway Management Planned: Natural Airway  Additional Equipment:   Intra-op Plan:   Post-operative Plan:   Informed Consent: I have reviewed the patients History and Physical, chart, labs and discussed the procedure including the risks, benefits and alternatives for the proposed anesthesia with the patient or authorized representative who has indicated his/her understanding and acceptance.     Plan Discussed with: CRNA, Anesthesiologist and Surgeon  Anesthesia Plan Comments:         Anesthesia Quick Evaluation

## 2017-03-21 NOTE — Progress Notes (Signed)
Patient reports no concerns- she states her glucose levels are the same as previous.

## 2017-03-21 NOTE — H&P (Signed)
LABOR AND DELIVERY ADMISSION HISTORY AND PHYSICAL NOTE  Vanessa Marks is a 36 y.o. female [phone] with IUP at 352w6d by LMP confirmed with 11 week  US presenting for IOL of labor in the setting of gHTN. Patient also has GDM that is diet controlled with LGA fetus 57% on 5/1. Patient also have an abnormal genetic test with increased risk for Down syndrome but declined NIPS. Patient first pregnancy was uncomplicated.  She reports positive fetal movement. She denies leakage of fluid or vaginal bleeding.  Prenatal History/Complications:  Past Medical History: Past Medical History:  Diagnosis Date  . Asthma   . Diabetes mellitus without complication (HCC)   . No pertinent past medical history     Past Surgical History: Past Surgical History:  Procedure Laterality Date  . NO PAST SURGERIES      Obstetrical History: OB History    Gravida Para Term Preterm AB Living   4 2 2  0 1 2   SAB TAB Ectopic Multiple Live Births   1 0 0 0 2      Social History: Social History   Social History  . Marital status: Married    Spouse name: N/A  . Number of children: N/A  . Years of education: N/A   Social History Main Topics  . Smoking status: Never Smoker  . Smokeless tobacco: Never Used  . Alcohol use No  . Drug use: No  . Sexual activity: Yes    Birth control/ protection: None   Other Topics Concern  . Not on file   Social History Narrative  . No narrative on file    Family History: No family history on file.  Allergies: No Known Allergies  Prescriptions Prior to Admission  Medication Sig Dispense Refill Last Dose  . albuterol (PROVENTIL) (2.5 MG/3ML) 0.083% nebulizer solution Take 2.5 mg by nebulization every 6 (six) hours as needed for wheezing or shortness of breath.   Taking  . glucose blood (ACCU-CHEK GUIDE) test strip Use one test strip to check blood glucose 4 times daily 100 each 5 Taking  . Lancets (ACCU-CHEK MULTICLIX) lancets Use lancet to check blood glucose 4  times daily 100 each 5 Taking  . Prenatal Vit-Fe Fumarate-FA (PRENATAL MULTIVITAMIN) TABS tablet Take 1 tablet by mouth daily at 12 noon.   Taking     Review of Systems   All systems reviewed and negative except as stated in HPI  Last menstrual period 06/29/2016, currently breastfeeding. General appearance: alert, cooperative and appears stated age Lungs: Normal respiratory effort, no audible wheezing Heart: regular rate and pulses palpated bilaterally upper and lower extremities Abdomen: soft, non-tender; gravid abdomen appropriate for gestational age Extremities: No calf swelling or tenderness Presentation: cephalic nurse exam Fetal monitoring: FHR 130, good variability, + accel, no decel   Uterine activity: irregular    Prenatal labs: ABO, Rh: A/Positive/-- (11/15 1641) Antibody: Negative (11/15 1641) Rubella:Iimmune  RPR: Non Reactive (03/08 1105)  HBsAg: Negative (11/15 1641)  HIV: Non Reactive (03/08 1105)  GBS: Negative (05/02 0000)  1 hr Glucola: 96 Genetic screening:  Abnormal, risk of down, declined nips Anatomy US: normal   Prenatal Transfer Tool  Maternal Diabetes: Yes:  Diabetes Type:  Diet controlled Genetic Screening: Abnormal:  Results: Elevated risk of Trisomy 21 Maternal Ultrasounds/Referrals: Normal Fetal Ultrasounds or other Referrals:  None Maternal Substance Abuse:  No Significant Maternal Medications:  None Significant Maternal Lab Results: Lab values include: Group B Strep negative  No results found for this or  any previous visit (from the past 24 hour(s)).  Patient Active Problem List   Diagnosis Date Noted  . Encounter for induction of labor 03/21/2017  . LGA (large for gestational age) fetus affecting management of mother 02/10/2017  . Gestational diabetes mellitus, antepartum 01/14/2017  . Abnormal MSAFP (maternal serum alpha-fetoprotein), decreased 11/09/2016  . Susceptible to varicella (non-immune), currently pregnant 09/29/2016  .  Supervision of high-risk pregnancy 09/22/2016    Assessment: Vanessa Marks is a 36 y.o. Z6X0960 at [redacted]w[redacted]d here for IOL in the setting of gHTN and GDM.  #Labor: Induction and augmentation protocol #Pain: IV pains med, epidural upon maternal request #FWB:  Cat 1 #ID:  Negative #MOF: Breast and Bottle #MOC: IUD #Circ:  Outpatient ([facility])  Abdoulaye Diallo. PGY-1 03/21/2017, 11:37 AM  OB FELLOW HISTORY AND PHYSICAL ATTESTATION  I confirm that I have verified the information documented in the resident's note and that I have also personally reperformed the physical exam and all medical decision making activities.      Ernestina Penna 03/21/2017, 11:09 PM

## 2017-03-21 NOTE — Progress Notes (Signed)
   PRENATAL VISIT NOTE  Subjective:  [name] is a 36 y.o. Z6X0960G4P2012 at 5929w6d being seen today for ongoing prenatal care.  She is currently monitored for the following issues for this high-risk pregnancy and has Supervision of high-risk pregnancy; Susceptible to varicella (non-immune), currently pregnant; Abnormal MSAFP (maternal serum alpha-fetoprotein), decreased; Gestational diabetes mellitus, antepartum; and LGA (large for gestational age) fetus affecting management of mother on her problem list.  Patient reports no complaints.  Contractions: Irregular. Vag. Bleeding: None.  Movement: Present. Denies leaking of fluid.   The following portions of the patient's history were reviewed and updated as appropriate: allergies, current medications, past family history, past medical history, past social history, past surgical history and problem list. Problem list updated.  Objective:   Vitals:   03/21/17 0859 03/21/17 0910  BP: (!) 150/83 130/78  Pulse: (!) 102   Weight: 202 lb 9.6 oz (91.9 kg)     Fetal Status: Fetal Heart Rate (bpm): 130   Movement: Present     General:  Alert, oriented and cooperative. Patient is in no acute distress.  Skin: Skin is warm and dry. No rash noted.   Cardiovascular: Normal heart rate noted  Respiratory: Normal respiratory effort, no problems with respiration noted  Abdomen: Soft, gravid, appropriate for gestational age. Pain/Pressure: Absent     Pelvic:  Cervical exam deferred        Extremities: Normal range of motion.  Edema: Trace  Mental Status: Normal mood and affect. Normal behavior. Normal judgment and thought content.   Assessment and Plan:  Pregnancy: [name] at 5229w6d  1. Supervision of high risk pregnancy in third trimester Patient is doing well without complaints. She denies HA. Visual changes, RUQ/epigastric pain Patient with elevated BP on 2 occasions meeting diagnosis criteria for GHTN. Plan for IOL today. Birthing suite and on call  team informed. Patient needs to return home but plans to arrive at the hospital around 11am  2. Excessive fetal growth affecting management of pregnancy in third trimester, single or unspecified fetus   3. Diet controlled gestational diabetes mellitus (GDM), antepartum CBGs reviewed with elevated fasting as high 101 (most values in low 90's) and pp within range with the exception of one value after dinner 130  4. Abnormal MSAFP (maternal serum alpha-fetoprotein), decreased Declined NIPS  5. Susceptible to varicella (non-immune), currently pregnant Will offer pp  Term labor symptoms and general obstetric precautions including but not limited to vaginal bleeding, contractions, leaking of fluid and fetal movement were reviewed in detail with the patient. Please refer to After Visit Summary for other counseling recommendations.  Return in about 6 weeks (around 05/02/2017) for pp.   Melessa Cowell, Gigi GinPeggy, MD

## 2017-03-22 ENCOUNTER — Encounter (HOSPITAL_COMMUNITY): Payer: Self-pay

## 2017-03-22 DIAGNOSIS — O24420 Gestational diabetes mellitus in childbirth, diet controlled: Secondary | ICD-10-CM

## 2017-03-22 DIAGNOSIS — Z3A38 38 weeks gestation of pregnancy: Secondary | ICD-10-CM

## 2017-03-22 DIAGNOSIS — O134 Gestational [pregnancy-induced] hypertension without significant proteinuria, complicating childbirth: Secondary | ICD-10-CM

## 2017-03-22 DIAGNOSIS — O3663X0 Maternal care for excessive fetal growth, third trimester, not applicable or unspecified: Secondary | ICD-10-CM

## 2017-03-22 LAB — CBC
HCT: 30.7 % — ABNORMAL LOW (ref 36.0–46.0)
HEMATOCRIT: 27.7 % — AB (ref 36.0–46.0)
HEMOGLOBIN: 9.2 g/dL — AB (ref 12.0–15.0)
Hemoglobin: 10 g/dL — ABNORMAL LOW (ref 12.0–15.0)
MCH: 26.6 pg (ref 26.0–34.0)
MCH: 26.7 pg (ref 26.0–34.0)
MCHC: 32.6 g/dL (ref 30.0–36.0)
MCHC: 33.2 g/dL (ref 30.0–36.0)
MCV: 81.6 fL (ref 78.0–100.0)
MCV: 80.5 fL (ref 78.0–100.0)
PLATELETS: 137 10*3/uL — AB (ref 150–400)
PLATELETS: 141 10*3/uL — AB (ref 150–400)
RBC: 3.76 MIL/uL — AB (ref 3.87–5.11)
RBC: 3.44 MIL/uL — AB (ref 3.87–5.11)
RDW: 14.7 % (ref 11.5–15.5)
RDW: 14.6 % (ref 11.5–15.5)
WBC: 12.4 10*3/uL — ABNORMAL HIGH (ref 4.0–10.5)
WBC: 13.7 10*3/uL — AB (ref 4.0–10.5)

## 2017-03-22 LAB — RPR: RPR Ser Ql: NONREACTIVE

## 2017-03-22 LAB — GLUCOSE, CAPILLARY: Glucose-Capillary: 96 mg/dL (ref 65–99)

## 2017-03-22 MED ORDER — OXYTOCIN 40 UNITS IN LACTATED RINGERS INFUSION - SIMPLE MED: 2.5000 [IU]/h | INTRAVENOUS | Status: DC | PRN

## 2017-03-22 MED ORDER — ONDANSETRON HCL 4 MG/2ML IJ SOLN: 4.0000 mg | INTRAMUSCULAR | Status: DC | PRN

## 2017-03-22 MED ORDER — LACTATED RINGERS IV SOLN
125.0000 mL/h | INTRAVENOUS | Status: DC
  Administered 2017-03-22: 999 mL/h via INTRAVENOUS

## 2017-03-22 MED ORDER — SODIUM CHLORIDE 0.9% FLUSH
3.0000 mL | Freq: Two times a day (BID) | INTRAVENOUS | Status: DC
  Administered 2017-03-22 – 2017-03-23 (×2): 3 mL via INTRAVENOUS

## 2017-03-22 MED ORDER — PRENATAL MULTIVITAMIN CH
1.0000 | ORAL_TABLET | Freq: Every day | ORAL | Status: DC
  Administered 2017-03-22 – 2017-03-23 (×2): 1 via ORAL
  Filled 2017-03-22 (×2): qty 1

## 2017-03-22 MED ORDER — OXYTOCIN 40 UNITS IN LACTATED RINGERS INFUSION - SIMPLE MED
INTRAVENOUS | Status: AC
  Filled 2017-03-22: qty 1000

## 2017-03-22 MED ORDER — SODIUM CHLORIDE 0.9 % IV SOLN
250.0000 mL | INTRAVENOUS | Status: DC | PRN
  Administered 2017-03-23: 250 mL via INTRAVENOUS

## 2017-03-22 MED ORDER — IBUPROFEN 600 MG PO TABS
600.0000 mg | ORAL_TABLET | Freq: Four times a day (QID) | ORAL | Status: DC
  Administered 2017-03-22 – 2017-03-24 (×9): 600 mg via ORAL
  Filled 2017-03-22 (×9): qty 1

## 2017-03-22 MED ORDER — ACETAMINOPHEN 325 MG PO TABS
650.0000 mg | ORAL_TABLET | ORAL | Status: DC | PRN
  Administered 2017-03-22: 650 mg via ORAL

## 2017-03-22 MED ORDER — TETANUS-DIPHTH-ACELL PERTUSSIS 5-2.5-18.5 LF-MCG/0.5 IM SUSP
0.5000 mL | Freq: Once | INTRAMUSCULAR | Status: AC
  Administered 2017-03-24: 0.5 mL via INTRAMUSCULAR
  Filled 2017-03-22: qty 0.5

## 2017-03-22 MED ORDER — CEFAZOLIN SODIUM-DEXTROSE 2-4 GM/100ML-% IV SOLN
2.0000 g | Freq: Once | INTRAVENOUS | Status: AC
  Administered 2017-03-22: 2 g via INTRAVENOUS
  Filled 2017-03-22: qty 100

## 2017-03-22 MED ORDER — FENTANYL CITRATE (PF) 100 MCG/2ML IJ SOLN
INTRAMUSCULAR | Status: AC
  Administered 2017-03-22: 100 ug via INTRAVENOUS
  Filled 2017-03-22: qty 4

## 2017-03-22 MED ORDER — METHYLERGONOVINE MALEATE 0.2 MG/ML IJ SOLN
0.2000 mg | INTRAMUSCULAR | Status: DC | PRN
Start: 2017-03-22 — End: 2017-03-24
  Administered 2017-03-22: 0.2 mg via INTRAMUSCULAR

## 2017-03-22 MED ORDER — SIMETHICONE 80 MG PO CHEW
80.0000 mg | CHEWABLE_TABLET | ORAL | Status: DC | PRN
  Administered 2017-03-22: 80 mg via ORAL

## 2017-03-22 MED ORDER — METHYLERGONOVINE MALEATE 0.2 MG PO TABS
0.2000 mg | ORAL_TABLET | ORAL | Status: DC | PRN
  Administered 2017-03-22: 0.2 mg via ORAL
  Filled 2017-03-22: qty 1

## 2017-03-22 MED ORDER — MISOPROSTOL 200 MCG PO TABS
ORAL_TABLET | ORAL | Status: AC
  Administered 2017-03-22: 600 ug via BUCCAL
  Filled 2017-03-22: qty 5

## 2017-03-22 MED ORDER — DIBUCAINE 1 % RE OINT: 1.0000 "application " | TOPICAL_OINTMENT | RECTAL | Status: DC | PRN

## 2017-03-22 MED ORDER — DOCUSATE SODIUM 100 MG PO CAPS
100.0000 mg | ORAL_CAPSULE | Freq: Two times a day (BID) | ORAL | Status: DC
  Administered 2017-03-22 – 2017-03-24 (×4): 100 mg via ORAL
  Filled 2017-03-22 (×4): qty 1

## 2017-03-22 MED ORDER — DIPHENHYDRAMINE HCL 25 MG PO CAPS: 25.0000 mg | ORAL_CAPSULE | Freq: Four times a day (QID) | ORAL | Status: DC | PRN

## 2017-03-22 MED ORDER — WITCH HAZEL-GLYCERIN EX PADS: 1.0000 "application " | MEDICATED_PAD | CUTANEOUS | Status: DC | PRN

## 2017-03-22 MED ORDER — SODIUM CHLORIDE 0.9% FLUSH: 3.0000 mL | INTRAVENOUS | Status: DC | PRN

## 2017-03-22 MED ORDER — CARBOPROST TROMETHAMINE 250 MCG/ML IM SOLN
INTRAMUSCULAR | Status: AC
  Filled 2017-03-22: qty 1

## 2017-03-22 MED ORDER — ZOLPIDEM TARTRATE 5 MG PO TABS: 5.0000 mg | ORAL_TABLET | Freq: Every evening | ORAL | Status: DC | PRN

## 2017-03-22 MED ORDER — METHYLERGONOVINE MALEATE 0.2 MG PO TABS
0.2000 mg | ORAL_TABLET | Freq: Four times a day (QID) | ORAL | Status: AC
  Administered 2017-03-22 (×2): 0.2 mg via ORAL
  Filled 2017-03-22 (×2): qty 1

## 2017-03-22 MED ORDER — MISOPROSTOL 200 MCG PO TABS
600.0000 ug | ORAL_TABLET | Freq: Once | ORAL | Status: AC
  Administered 2017-03-22: 600 ug via BUCCAL

## 2017-03-22 MED ORDER — OXYCODONE HCL 5 MG PO TABS
5.0000 mg | ORAL_TABLET | ORAL | Status: DC | PRN
  Administered 2017-03-22 – 2017-03-23 (×2): 5 mg via ORAL
  Filled 2017-03-22 (×2): qty 1

## 2017-03-22 MED ORDER — BENZOCAINE-MENTHOL 20-0.5 % EX AERO
1.0000 "application " | INHALATION_SPRAY | CUTANEOUS | Status: DC | PRN
  Administered 2017-03-22: 1 via TOPICAL
  Filled 2017-03-22: qty 56

## 2017-03-22 MED ORDER — ONDANSETRON HCL 4 MG PO TABS: 4.0000 mg | ORAL_TABLET | ORAL | Status: DC | PRN

## 2017-03-22 MED ORDER — FENTANYL CITRATE (PF) 100 MCG/2ML IJ SOLN
100.0000 ug | Freq: Once | INTRAMUSCULAR | Status: AC
  Administered 2017-03-22: 100 ug via INTRAVENOUS

## 2017-03-22 MED ORDER — COCONUT OIL OIL
1.0000 "application " | TOPICAL_OIL | Status: DC | PRN
  Filled 2017-03-22: qty 120

## 2017-03-22 MED ORDER — SENNOSIDES-DOCUSATE SODIUM 8.6-50 MG PO TABS
2.0000 | ORAL_TABLET | ORAL | Status: DC
  Administered 2017-03-22 – 2017-03-23 (×2): 2 via ORAL
  Filled 2017-03-22 (×2): qty 2

## 2017-03-22 NOTE — Progress Notes (Signed)
When RN went into pt's room to do her 4-hour check, pt had blood on her gown and bed pad, and pt stated she felt like her BP was low.  Patient was dizzy, slightly nauseated, and had cramping that she rated at "6".   BP: 124/60, P: 90, R: 20, oxygen saturation 100% on room air.  Pt's fundus and lochia were assessed.  Fundus was firm with massage, and her lochia was moderate with a few large clots.  A code hemorrhage was called and the team arrived promptly.  Delice BisonBettie Marykate Heuberger RCharity fundraiser

## 2017-03-22 NOTE — Progress Notes (Addendum)
Patient ID: Vanessa Marks, female   DOB: 1981/06/22, 36 y.o.   MRN: [id number]  S: Patient seen & examined, called to patient's room to assess progress due to repetitive variable decels, found to be completely dilated with bulging bag of water. Patient comfortable with epidural.    O:  Vitals:   03/21/17 2231 03/21/17 2301 03/21/17 2331 03/22/17 0001  BP: (!) 111/49 (!) 118/51 (!) 119/45 (!) 119/48  Pulse: 90 96 85 79  Resp: 16 18 16 16   Temp:  98.4 F (36.9 C)    TempSrc:  Oral    SpO2:      Weight:      Height:        Dilation: 10 Dilation Complete Date: 03/22/17 Dilation Complete Time: 0010 Effacement (%): 70 Cervical Position: Posterior Station: -3 Presentation: Vertex Exam by:: Cassie, RN  AROM performed, clear fluid returned.  IUPC placed with ease. FSE placed   FHT: 120 bpm, mod var, no accels, early decels after AROM TOCO: q1.635min   A/P: AROM performed FSE placed IUPC placed Stopped pitocin Continue expectant management Anticipate SVD

## 2017-03-22 NOTE — Progress Notes (Addendum)
Called to evaluate patient with postpartum hemorrhage.  Patient found lying in bed with large collection of clot between her legs. She reports feeling dizzy. She denies CP or SOB.  Blood pressure (!) 111/48, pulse 90, temperature 99.2 F (37.3 C), temperature source Oral, resp. rate 16, height 5\' 4"  (1.626 m), weight 202 lb (91.6 kg), last menstrual period 06/29/2016, SpO2 100 %, unknown if currently breastfeeding.  Abdomen: soft, NT. Palpable 16-week size uterus, firm and non tender Sterile glove exam: firm uterus with minimal clots evacuated from uterus. No vaginal or cervical laceration visualized or palpated  A/P 36 yo s/p SVD with postpartum hemorrhage - EBL- 880 cc in postpartum units - CBC ordered - Cytotec buccally administered - IV antibiotics due to exam - Monitor closely - Patient was made aware of possible need for blood transfusion pending Hg level - Continue routine postpartum care - Plan for Bakri balloon placement if persistent hemorrhage

## 2017-03-22 NOTE — Lactation Note (Signed)
This note was copied from a baby's chart. Lactation Consultation Note  Patient Name: Vanessa Brain HiltsSalma Craker FAOZH'YToday's Date: 03/22/2017 Reason for consult: Initial assessment Breastfeeding consultation services and support information given to patient.  This is mom's third baby and she breastfed first two for two years each.  Mom hemorrhaged this AM and lost 800 mls.  She is feeling better now.  Newborn is 8511 hours old and latching well per mom.  Instructed to feed with any feeding cue and call for assist/concerns prn.  Maternal Data Does the patient have breastfeeding experience prior to this delivery?: Yes  Feeding    LATCH Score/Interventions                      Lactation Tools Discussed/Used     Consult Status Consult Status: Follow-up Date: 03/23/17    Huston FoleyMOULDEN, Gildardo Tickner S 03/22/2017, 2:33 PM

## 2017-03-22 NOTE — Anesthesia Postprocedure Evaluation (Signed)
Anesthesia Post Note  Patient: Vanessa Marks  Procedure(s) Performed: * No procedures listed *  Patient location during evaluation: Mother Baby Anesthesia Type: Epidural Level of consciousness: awake and alert and oriented Pain management: pain level controlled Vital Signs Assessment: post-procedure vital signs reviewed and stable Respiratory status: spontaneous breathing and nonlabored ventilation Cardiovascular status: stable Postop Assessment: no headache, no backache, epidural receding, patient able to bend at knees, no signs of nausea or vomiting and adequate PO intake Anesthetic complications: no        Last Vitals:  Vitals:   03/22/17 0516 03/22/17 0607  BP: (!) 132/59 (!) 111/48  Pulse: 99 90  Resp: 18 16  Temp: 37.3 C     Last Pain:  Vitals:   03/22/17 0516  TempSrc: Oral  PainSc:    Pain Goal: Patients Stated Pain Goal: 3 (03/21/17 1812)               Laban EmperorMalinova,Kristen Bushway Hristova

## 2017-03-23 LAB — CBC
HCT: 21.7 % — ABNORMAL LOW (ref 36.0–46.0)
Hemoglobin: 7.2 g/dL — ABNORMAL LOW (ref 12.0–15.0)
MCH: 26.9 pg (ref 26.0–34.0)
MCHC: 33.2 g/dL (ref 30.0–36.0)
MCV: 81 fL (ref 78.0–100.0)
Platelets: 141 10*3/uL — ABNORMAL LOW (ref 150–400)
RBC: 2.68 MIL/uL — AB (ref 3.87–5.11)
RDW: 15.1 % (ref 11.5–15.5)
WBC: 11.2 10*3/uL — AB (ref 4.0–10.5)

## 2017-03-23 LAB — GLUCOSE, RANDOM: GLUCOSE: 113 mg/dL — AB (ref 65–99)

## 2017-03-23 LAB — GLUCOSE, CAPILLARY: GLUCOSE-CAPILLARY: 93 mg/dL (ref 65–99)

## 2017-03-23 LAB — PREPARE RBC (CROSSMATCH)

## 2017-03-23 MED ORDER — SODIUM CHLORIDE 0.9 % IV SOLN
Freq: Once | INTRAVENOUS | Status: AC
  Administered 2017-03-23: 11:00:00 via INTRAVENOUS

## 2017-03-23 NOTE — Lactation Note (Signed)
This note was copied from a baby's chart. Lactation Consultation Note  Patient Name: Vanessa Brain HiltsSalma Bigbee ZOXWR'UToday's Date: 03/23/2017    I found Mom sleeping with infant in bed (it appeared that she had been nursing him in a side-lying position and then fell asleep). I woke Mom & asked her not to fall asleep with infant in bed. RN made aware.  Mom declines LC assist & denies any problems w/breastfeeding.  Lurline HareRichey, Yochanan Eddleman [facility] 03/23/2017, 9:53 PM

## 2017-03-23 NOTE — Progress Notes (Signed)
CSW received call from CN staff stating that a CPS worker was in room interviewing MOB.  CSW asked staff to have CPS worker call CSW when she was finished talking with MOB.  CSW has not received a call.  CSW called intake worker who states case is open with Vanessa Marks.  CSW contacted Ms. Marks to ensure no barriers to discharge.  She confirmed, stating "everything's fine."   

## 2017-03-23 NOTE — Progress Notes (Signed)
Post Partum Day #1   Subjective: up ad lib and tolerating PO  Objective: Blood pressure (!) 110/42, pulse 88, temperature 98 F (36.7 C), temperature source Oral, resp. rate 18, height 5\' 4"  (1.626 m), weight 202 lb (91.6 kg), last menstrual period 06/29/2016, SpO2 100 %, unknown if currently breastfeeding.  Physical Exam:  General: alert, cooperative and no distress Lochia: appropriate Uterine Fundus: firm Incision: Laceration: 3rd degree, class 3-B (>50% EAS torn, but still intact) Suture: 3-0 Vicryl; 2-0 Vicryl DVT Evaluation: No evidence of DVT seen on physical exam. No cords or calf tenderness. No significant calf/ankle edema.   Recent Labs  03/22/17 1018 03/23/17 0532  HGB 9.2* 7.2*  HCT 27.7* 21.7*    Assessment/Plan: Continue current care.  Remove foley this AM.  Anemia d/t PPH, consider PRBC transfusion versus iron.  Does have mild tachycardia noted with vital signs will discuss with attendings.    LOS: 2 days   Vanessa Marks, CNM 03/23/2017, 7:30 AM

## 2017-03-23 NOTE — Plan of Care (Signed)
Problem: Life Cycle: Goal: Risk for postpartum hemorrhage will decrease Outcome: Progressing Pt has displayed no continuing signs of PPH this shift

## 2017-03-24 LAB — TYPE AND SCREEN
ABO/RH(D): A POS
ANTIBODY SCREEN: NEGATIVE
UNIT DIVISION: 0
UNIT DIVISION: 0
UNIT DIVISION: 0
Unit division: 0

## 2017-03-24 LAB — BPAM RBC
BLOOD PRODUCT EXPIRATION DATE: 201806022359
BLOOD PRODUCT EXPIRATION DATE: 201806022359
BLOOD PRODUCT EXPIRATION DATE: 201806022359
Blood Product Expiration Date: 201805242359
ISSUE DATE / TIME: 201805161344
ISSUE DATE / TIME: 201805161015
UNIT TYPE AND RH: 6200
Unit Type and Rh: 5100
Unit Type and Rh: 6200
Unit Type and Rh: 6200

## 2017-03-24 LAB — CBC
HCT: 26.5 % — ABNORMAL LOW (ref 36.0–46.0)
HEMOGLOBIN: 9.1 g/dL — AB (ref 12.0–15.0)
MCH: 27.7 pg (ref 26.0–34.0)
MCHC: 34.3 g/dL (ref 30.0–36.0)
MCV: 80.5 fL (ref 78.0–100.0)
Platelets: 165 10*3/uL (ref 150–400)
RBC: 3.29 MIL/uL — ABNORMAL LOW (ref 3.87–5.11)
RDW: 14.9 % (ref 11.5–15.5)
WBC: 13.4 10*3/uL — ABNORMAL HIGH (ref 4.0–10.5)

## 2017-03-24 MED ORDER — IBUPROFEN 600 MG PO TABS: 600.0000 mg | ORAL_TABLET | Freq: Four times a day (QID) | ORAL | 2 refills | Status: DC

## 2017-03-24 MED ORDER — IRON 325 (65 FE) MG PO TABS: 1.0000 | ORAL_TABLET | Freq: Three times a day (TID) | ORAL | 2 refills | Status: DC

## 2017-03-24 MED ORDER — OXYCODONE-ACETAMINOPHEN 5-325 MG PO TABS: 1.0000 | ORAL_TABLET | ORAL | 0 refills | Status: DC | PRN

## 2017-03-24 NOTE — Progress Notes (Signed)
Post Partum Day #2 Subjective: no complaints, up ad lib, voiding and tolerating PO  Objective: Blood pressure (!) 100/49, pulse 80, temperature 98.2 F (36.8 C), temperature source Oral, resp. rate 16, height 5\' 4"  (1.626 m), weight 202 lb (91.6 kg), last menstrual period 06/29/2016, SpO2 100 %, unknown if currently breastfeeding.  Physical Exam:  General: alert, cooperative and no distress Lochia: appropriate Uterine Fundus: firm Incision: Laceration: 3rd degree, class 3-B (>50% EAS torn, but still intact) Suture: 3-0 Vicryl; 2-0 Vicryl DVT Evaluation: No evidence of DVT seen on physical exam. No cords or calf tenderness. No significant calf/ankle edema.   Recent Labs  03/22/17 1018 03/23/17 0532  HGB 9.2* 7.2*  HCT 27.7* 21.7*    Assessment/Plan: Discharge home, Breastfeeding and Contraception IUD planned.   CBC pending.  VSS.    LOS: 3 days   Vanessa Marks, CNM 03/24/2017, 7:15 AM

## 2017-03-24 NOTE — Progress Notes (Signed)
Discharge instructions given, questions answered, pt states understanding, prescription given, pt signs instructions and given copy

## 2017-03-24 NOTE — Lactation Note (Signed)
This note was copied from a baby's chart. Lactation Consultation Note  Patient Name: Vanessa Marks ZOXWR'UToday's Date: 03/24/2017 Reason for consult: Follow-up assessment Baby at 55 hr of life and dyad set for d/c today. Baby has a 7% wt loss. Mom can easily express large drops of colostrum. She will offer the breast on demand q3hr, post express, and offer expressed milk per volume guidelines. Mom will call for lactation to see a latch before she leaves today. She has an OP apt for 03/31/17 at 830. Parents are aware of lactation services and support group. They will call as needed.   Maternal Data Formula Feeding for Exclusion: No Does the patient have breastfeeding experience prior to this delivery?: Yes  Feeding    LATCH Score/Interventions                      Lactation Tools Discussed/Used WIC Program: No   Consult Status Consult Status: Complete Follow-up type: Call as needed    Vanessa Marks 03/24/2017, 10:45 AM

## 2017-03-24 NOTE — Discharge Summary (Signed)
OB Discharge Summary     Patient Name: Vanessa Marks DOB: 06-03-1981 MRN: [id number]  Date of admission: 03/21/2017 Delivering MD: Hermina Staggers   Date of discharge: 03/24/2017  Admitting diagnosis: INDUCTION Intrauterine pregnancy: [redacted]w[redacted]d     Secondary diagnosis:  Active Problems:   Encounter for induction of labor  Additional problems: LGA infant, operative vaginal delivery, post delivery PPH, GDMA1     Discharge diagnosis: Term Pregnancy Delivered, Anemia and PPH                                                                                                Post partum procedures:blood transfusion X2 units  Augmentation: AROM and Pitocin  Complications: Hemorrhage>107mL  Hospital course:  Induction of Labor With Vaginal Delivery   36 y.o. yo [phone] at [redacted]w[redacted]d was admitted to the hospital 03/21/2017 for induction of labor.  Indication for induction: A1 DM.  Patient had an uncomplicated labor course as follows: Membrane Rupture Time/Date: 12:20 AM ,03/22/2017   Intrapartum Procedures: Episiotomy: Median [2]                                         Lacerations:  3rd degree [4]  Patient had delivery of a Viable infant.  Information for the patient's newborn:  Katalaya, Beel [[id number]]  Delivery Method: Vaginal, Forceps (Filed from Delivery Summary)   03/22/2017  Details of delivery can be found in separate delivery note.  Patient had a routine postpartum course. Patient is discharged home 03/24/17.  Physical exam  Vitals:   03/23/17 1345 03/23/17 1405 03/23/17 1643 03/24/17 0526  BP: (!) 102/47 (!) 113/49 (!) 128/57 (!) 100/49  Pulse: 89 100 98 80  Resp: 18 20 20 16   Temp: 98.3 F (36.8 C) 98.2 F (36.8 C) 98.2 F (36.8 C) 98.2 F (36.8 C)  TempSrc: Oral   Oral  SpO2:      Weight:      Height:       General: alert, cooperative and no distress Lochia: appropriate Uterine Fundus: firm Incision: Laceration: 3rd degree, class 3-B (>50% EAS torn, but still  intact) Suture: 3-0 Vicryl; 2-0 Vicryl; healing DVT Evaluation: No evidence of DVT seen on physical exam. No cords or calf tenderness. No significant calf/ankle edema. Labs: Lab Results  Component Value Date   WBC 11.2 (H) 03/23/2017   HGB 7.2 (L) 03/23/2017   HCT 21.7 (L) 03/23/2017   MCV 81.0 03/23/2017   PLT 141 (L) 03/23/2017   CMP Latest Ref Rng & Units 03/23/2017  Glucose 65 - 99 mg/dL 130(Q)  BUN 6 - 20 mg/dL -  Creatinine 6.57 - 8.46 mg/dL -  Sodium 962 - 952 mmol/L -  Potassium 3.5 - 5.1 mmol/L -  Chloride 101 - 111 mmol/L -  CO2 22 - 32 mmol/L -  Calcium 8.9 - 10.3 mg/dL -  Total Protein 6.5 - 8.1 g/dL -  Total Bilirubin 0.3 - 1.2 mg/dL -  Alkaline Phos 38 - 841 U/L -  AST 15 - 41 U/L -  ALT 14 - 54 U/L -    Discharge instruction: per After Visit Summary and "Baby and Me Booklet".  After visit meds:  Allergies as of 03/24/2017   No Known Allergies     Medication List    STOP taking these medications   accu-chek multiclix lancets   glucose blood test strip Commonly known as:  ACCU-CHEK GUIDE     TAKE these medications   albuterol (2.5 MG/3ML) 0.083% nebulizer solution Commonly known as:  PROVENTIL Take 2.5 mg by nebulization every 6 (six) hours as needed for wheezing or shortness of breath.   ibuprofen 600 MG tablet Commonly known as:  ADVIL,MOTRIN Take 1 tablet (600 mg total) by mouth every 6 (six) hours.   Iron 325 (65 Fe) MG Tabs Take 1 tablet by mouth 3 (three) times daily.   oxyCODONE-acetaminophen 5-325 MG tablet Commonly known as:  PERCOCET/ROXICET Take 1 tablet by mouth every 4 (four) hours as needed (for pain scale greater than or equal to 4 and less than 7).   prenatal multivitamin Tabs tablet Take 1 tablet by mouth daily at 12 noon.       Diet: routine diet  Activity: Advance as tolerated. Pelvic rest for 6 weeks.   Outpatient follow up:4 weeks Follow up Appt:No future appointments. Follow up Visit:No Follow-up on  file.  Postpartum contraception: IUD Mirena  Newborn Data: Live born female  Birth Weight: 7 lb 10.1 oz (3460 g) APGAR: 9, 9  Baby Feeding: Bottle and Breast Disposition:home with mother   03/24/2017 Roe Coombsachelle A Billy Turvey, CNM

## 2017-04-25 ENCOUNTER — Ambulatory Visit (INDEPENDENT_AMBULATORY_CARE_PROVIDER_SITE_OTHER): Payer: Medicaid Other | Admitting: Obstetrics and Gynecology

## 2017-04-25 ENCOUNTER — Encounter: Payer: Self-pay | Admitting: *Deleted

## 2017-04-25 ENCOUNTER — Encounter: Payer: Self-pay | Admitting: Obstetrics and Gynecology

## 2017-04-25 DIAGNOSIS — Z30430 Encounter for insertion of intrauterine contraceptive device: Secondary | ICD-10-CM | POA: Diagnosis not present

## 2017-04-25 DIAGNOSIS — Z3202 Encounter for pregnancy test, result negative: Secondary | ICD-10-CM | POA: Diagnosis not present

## 2017-04-25 LAB — POCT URINE PREGNANCY: Preg Test, Ur: NEGATIVE

## 2017-04-25 MED ORDER — PARAGARD INTRAUTERINE COPPER IU IUD: 1.0000 | INTRAUTERINE_SYSTEM | Freq: Once | INTRAUTERINE | 0 refills | Status: AC

## 2017-04-25 NOTE — Patient Instructions (Signed)

## 2017-04-25 NOTE — Progress Notes (Signed)
Subjective:     [name] is a 36 y.o. female who presents for a postpartum visit. She is 4 weeks postpartum following a low forceps vaginal delivery. I have fully reviewed the prenatal and intrapartum course. The delivery was at 37 gestational weeks due to gestational HTN. Outcome: forceps, outlet. Anesthesia: epidural. Postpartum course has been uncomplicated. Baby's course has been uncomplicated. Baby is feeding by breast. Bleeding no bleeding. Bowel function is normal. Bladder function is normal. Patient is sexually active. Contraception method is condoms. Postpartum depression screening: negative.     Review of Systems Pertinent items are noted in HPI.   Objective:    BP 133/83   Pulse 76   Ht 5\' 4"  (1.626 m)   Wt 174 lb 11.2 oz (79.2 kg)   Breastfeeding? Yes   BMI 29.99 kg/m   General:  alert, cooperative and no distress   Breasts:  inspection negative, no nipple discharge or bleeding, no masses or nodularity palpable  Lungs: clear to auscultation bilaterally  Heart:  regular rate and rhythm  Abdomen: soft, non-tender; bowel sounds normal; no masses,  no organomegaly   Vulva:  normal  Vagina: normal vagina, no discharge, exudate, lesion, or erythema  Cervix:  multiparous appearance  Corpus: normal size, contour, position, consistency, mobility, non-tender  Adnexa:  no mass, fullness, tenderness  Rectal Exam: Not performed.        Assessment:     Normal postpartum exam. Pap smear not done at today's visit.   Plan:    1. Contraception: IUD  IUD Procedure Note Patient identified, informed consent performed, signed copy in chart, time out was performed.  Urine pregnancy test negative.  Speculum placed in the vagina.  Cervix visualized.  Cleaned with Betadine x 2.  Grasped anteriorly with a single tooth tenaculum.  Uterus sounded to 8 cm.  Paraguard IUD placed per manufacturer's recommendations.  Strings trimmed to 3 cm. Tenaculum was removed, good hemostasis noted.   Patient tolerated procedure well.   Patient given post procedure instructions and Paraguard care card with expiration date.  Patient is asked to check IUD strings periodically and follow up in 4-6 weeks for IUD check.   2. Patient is medically cleared to resume all activities of daily living 3. Follow up in: 4 weeks for IUD check and postpartum glucola or as needed.

## 2017-04-25 NOTE — Addendum Note (Signed)
Addended by: Hamilton CapriBURCH, ARIEL J on: 04/25/2017 04:39 PM   Modules accepted: Orders

## 2017-04-25 NOTE — Progress Notes (Deleted)
Post Partum Exam  Vanessa HiltsSalma Marks is a 36 y.o. [phone] female who presents for a postpartum visit. She is 4 weeks postpartum following a spontaneous vaginal delivery. I have fully reviewed the prenatal and intrapartum course. The delivery was at 38 gestational weeks.  Anesthesia: epidural. Postpartum course has been ***. Baby's course has been ***. Baby is feeding by both breast and bottle - Similac Advance. Bleeding no bleeding. Bowel function is normal. Bladder function is normal. Patient is sexually active. Contraception method is condoms. Postpartum depression screening:neg  {Common ambulatory SmartLinks:19316}  Review of Systems {ros; complete:30496}    Objective:  Blood pressure 133/83, pulse 76, height 5\' 4"  (1.626 m), weight 174 lb 11.2 oz (79.2 kg), currently breastfeeding.  General:  {gen appearance:16600}   Breasts:  {breast exam:1202::"inspection negative, no nipple discharge or bleeding, no masses or nodularity palpable"}  Lungs: {lung exam:16931}  Heart:  {heart exam:5510}  Abdomen: {abdomen exam:16834}   Vulva:  {labia exam:12198}  Vagina: {vagina exam:12200}  Cervix:  {cervix exam:14595}  Corpus: {uterus exam:12215}  Adnexa:  {adnexa exam:12223}  Rectal Exam: {rectal/vaginal exam:12274}        Assessment:    *** postpartum exam. Pap smear {done:10129} at today's visit.   Plan:   1. Contraception: {method:5051} 2. *** 3. Follow up in: {1-10:13787} {time; units:19136} or as needed.

## 2017-04-25 NOTE — Addendum Note (Signed)
Addended by: Hamilton CapriBURCH, ARIEL J on: 04/25/2017 04:43 PM   Modules accepted: Orders

## 2017-05-02 ENCOUNTER — Other Ambulatory Visit: Payer: Medicaid Other

## 2017-05-02 DIAGNOSIS — O0990 Supervision of high risk pregnancy, unspecified, unspecified trimester: Secondary | ICD-10-CM

## 2017-05-03 LAB — GLUCOSE TOLERANCE, 2 HOURS W/ 1HR
GLUCOSE, 1 HOUR: 131 mg/dL (ref 65–179)
GLUCOSE, FASTING: 91 mg/dL (ref 65–91)
Glucose, 2 hour: 80 mg/dL (ref 65–152)

## 2017-05-12 ENCOUNTER — Telehealth: Payer: Self-pay | Admitting: *Deleted

## 2017-05-12 NOTE — Telephone Encounter (Signed)
-----   Message from Catalina AntiguaPeggy Constant, MD sent at 05/03/2017  4:01 PM EDT ----- Please inform patient of failed 2 hour glucola with now the diagnosis of diabetes. She needs to follow up with a PCP for further management. In the meantime, she should follow the same diet and exercise plan as she did during pregnancy  Thanks  Peggy

## 2017-05-12 NOTE — Telephone Encounter (Signed)
Call to patient and informed her of her results. She does not have a primary care provider. Told patient I would let Dr Jolayne Pantheronstant know so we could do a referral for her so she could discuss her management with a primary care provider.

## 2017-12-18 ENCOUNTER — Encounter (HOSPITAL_COMMUNITY): Payer: Self-pay | Admitting: Emergency Medicine

## 2017-12-18 ENCOUNTER — Emergency Department (HOSPITAL_COMMUNITY): Payer: BLUE CROSS/BLUE SHIELD

## 2017-12-18 ENCOUNTER — Emergency Department (HOSPITAL_COMMUNITY)
Admission: EM | Admit: 2017-12-18 | Discharge: 2017-12-18 | Disposition: A | Discharge status: Home / Self Care | Payer: BLUE CROSS/BLUE SHIELD | Attending: Emergency Medicine | Admitting: Emergency Medicine

## 2017-12-18 DIAGNOSIS — J45909 Unspecified asthma, uncomplicated: Secondary | ICD-10-CM | POA: Insufficient documentation

## 2017-12-18 DIAGNOSIS — E119 Type 2 diabetes mellitus without complications: Secondary | ICD-10-CM | POA: Diagnosis not present

## 2017-12-18 DIAGNOSIS — R0789 Other chest pain: Secondary | ICD-10-CM | POA: Diagnosis present

## 2017-12-18 DIAGNOSIS — J029 Acute pharyngitis, unspecified: Secondary | ICD-10-CM | POA: Diagnosis not present

## 2017-12-18 LAB — BASIC METABOLIC PANEL
ANION GAP: 10 (ref 5–15)
BUN: 14 mg/dL (ref 6–20)
CALCIUM: 9.2 mg/dL (ref 8.9–10.3)
CO2: 22 mmol/L (ref 22–32)
Chloride: 106 mmol/L (ref 101–111)
Creatinine, Ser: 0.54 mg/dL (ref 0.44–1.00)
GFR calc Af Amer: >60 mL/min (ref >60)
Glucose, Bld: 120 mg/dL — ABNORMAL HIGH (ref 65–99)
Potassium: 3.4 mmol/L — ABNORMAL LOW (ref 3.5–5.1)
SODIUM: 138 mmol/L (ref 135–145)

## 2017-12-18 LAB — CBC
HCT: 34.7 % — ABNORMAL LOW (ref 36.0–46.0)
HEMOGLOBIN: 11.2 g/dL — AB (ref 12.0–15.0)
MCH: 26.5 pg (ref 26.0–34.0)
MCHC: 32.3 g/dL (ref 30.0–36.0)
MCV: 82 fL (ref 78.0–100.0)
Platelets: 246 10*3/uL (ref 150–400)
RBC: 4.23 MIL/uL (ref 3.87–5.11)
RDW: 13.4 % (ref 11.5–15.5)
WBC: 6.3 10*3/uL (ref 4.0–10.5)

## 2017-12-18 LAB — I-STAT BETA HCG BLOOD, ED (MC, WL, AP ONLY): I-stat hCG, quantitative: <5 m[IU]/mL (ref <5)

## 2017-12-18 LAB — I-STAT TROPONIN, ED: TROPONIN I, POC: 0 ng/mL (ref 0.00–0.08)

## 2017-12-18 MED ORDER — NAPROXEN 250 MG PO TABS
500.0000 mg | ORAL_TABLET | Freq: Once | ORAL | Status: AC
  Administered 2017-12-18: 500 mg via ORAL
  Filled 2017-12-18: qty 2

## 2017-12-18 MED ORDER — NAPROXEN 500 MG PO TABS: 500.0000 mg | ORAL_TABLET | Freq: Two times a day (BID) | ORAL | 0 refills | Status: DC

## 2017-12-18 MED ORDER — ONDANSETRON 4 MG PO TBDP
4.0000 mg | ORAL_TABLET | Freq: Once | ORAL | Status: AC
  Administered 2017-12-18: 4 mg via ORAL
  Filled 2017-12-18: qty 1

## 2017-12-18 MED ORDER — ONDANSETRON HCL 4 MG/2ML IJ SOLN: 4.0000 mg | Freq: Once | INTRAMUSCULAR | Status: DC

## 2017-12-18 NOTE — ED Provider Notes (Signed)
[facility] EMERGENCY DEPARTMENT Provider Note   CSN: [id number] Arrival date & time: 12/18/17  0113     History   Chief Complaint Chief Complaint  Patient presents with  . Chest Pain  . Sore Throat    HPI Vanessa Marks is a 37 y.o. female.  HPI  This is a 37 year old female with a history of asthma and diabetes who presents with chest pain.  Onset of chest pain 3 hours prior to evaluation.  Patient reports anterior chest pain that is nonradiating.  Acute in onset.  Denies any shortness of breath, fevers, cough.  Does report some sore throat.  Never had pain like this before.  Rates her pain at 5 out of 10.  Has not taken anything for the pain.  Past Medical History:  Diagnosis Date  . Asthma   . Diabetes mellitus without complication (HCC)   . No pertinent past medical history     Patient Active Problem List   Diagnosis Date Noted  . Encounter for induction of labor 03/21/2017  . LGA (large for gestational age) fetus affecting management of mother 02/10/2017  . Gestational diabetes mellitus, antepartum 01/14/2017  . Abnormal MSAFP (maternal serum alpha-fetoprotein), decreased 11/09/2016  . Susceptible to varicella (non-immune), currently pregnant 09/29/2016  . Supervision of high-risk pregnancy 09/22/2016    Past Surgical History:  Procedure Laterality Date  . NO PAST SURGERIES      OB History    Gravida Para Term Preterm AB Living   4 3 3  0 1 3   SAB TAB Ectopic Multiple Live Births   1 0 0 0 3       Home Medications    Prior to Admission medications   Medication Sig Start Date End Date Taking? Authorizing Provider  albuterol (PROVENTIL) (2.5 MG/3ML) 0.083% nebulizer solution Take 2.5 mg by nebulization every 6 (six) hours as needed for wheezing or shortness of breath.   Yes [provider]  Prenatal Vit-Fe Fumarate-FA (PRENATAL MULTIVITAMIN) TABS tablet Take 1 tablet by mouth daily at 12 noon.   Yes [provider]    ibuprofen (ADVIL,MOTRIN) 600 MG tablet Take 1 tablet (600 mg total) by mouth every 6 (six) hours. Patient not taking: Reported on 04/25/2017 03/24/17   Orvilla Cornwall A, CNM  naproxen (NAPROSYN) 500 MG tablet Take 1 tablet (500 mg total) by mouth 2 (two) times daily. 12/18/17   Saharsh Sterling, Mayer Masker, MD    Family History No family history on file.  Social History Social History   Tobacco Use  . Smoking status: Never Smoker  . Smokeless tobacco: Never Used  Substance Use Topics  . Alcohol use: No  . Drug use: No     Allergies   Patient has no known allergies.   Review of Systems Review of Systems  Constitutional: Negative for fever.  HENT: Positive for sore throat.   Respiratory: Negative for cough and shortness of breath.   Cardiovascular: Positive for chest pain. Negative for leg swelling.  Gastrointestinal: Positive for nausea. Negative for abdominal pain and vomiting.  All other systems reviewed and are negative.    Physical Exam Updated Vital Signs BP (!) 102/53   Pulse 71   Temp 97.6 F (36.4 C) (Oral)   Resp 14   Ht 5\' 4"  (1.626 m)   Wt 78.9 kg (174 lb)   SpO2 98%   BMI 29.87 kg/m   Physical Exam  Constitutional: She is oriented to person, place, and time. She appears  well-developed and well-nourished.  Obese, no acute distress  HENT:  Head: Normocephalic and atraumatic.  Oropharynx moist and clear, uvula midline  Eyes: Pupils are equal, round, and reactive to light.  Neck: Neck supple.  Cardiovascular: Normal rate, regular rhythm, normal heart sounds and normal pulses.  Tenderness palpation anterior chest wall, no crepitus  Pulmonary/Chest: Effort normal. No respiratory distress. She has no wheezes.  Abdominal: Soft. Bowel sounds are normal.  Musculoskeletal:       Right lower leg: She exhibits no edema.       Left lower leg: She exhibits no edema.  Neurological: She is alert and oriented to person, place, and time.  Skin: Skin is warm and dry.   Psychiatric: She has a normal mood and affect.  Nursing note and vitals reviewed.    ED Treatments / Results  Labs (all labs ordered are listed, but only abnormal results are displayed) Labs Reviewed  BASIC METABOLIC PANEL - Abnormal; Notable for the following components:      Result Value   Potassium 3.4 (*)    Glucose, Bld 120 (*)    All other components within normal limits  CBC - Abnormal; Notable for the following components:   Hemoglobin 11.2 (*)    HCT 34.7 (*)    All other components within normal limits  I-STAT TROPONIN, ED  I-STAT BETA HCG BLOOD, ED ([facility], [facility], [facility] ONLY)    EKG  EKG Interpretation  Date/Time:  Sunday December 18 2017 01:26:10 EST Ventricular Rate:  69 PR Interval:  160 QRS Duration: 80 QT Interval:  400 QTC Calculation: 428 R Axis:   65 Text Interpretation:  Sinus rhythm with marked sinus arrhythmia Otherwise normal ECG Confirmed by Ross Marcus ([id number]) on 12/18/2017 3:11:50 AM       Radiology Dg Chest 2 View  Result Date: 12/18/2017 CLINICAL DATA:  Acute onset of sore throat and generalized chest discomfort. Nausea. EXAM: CHEST  2 VIEW COMPARISON:  None. FINDINGS: The lungs are well-aerated and clear. There is no evidence of focal opacification, pleural effusion or pneumothorax. The heart is normal in size; the mediastinal contour is within normal limits. No acute osseous abnormalities are seen. IMPRESSION: No acute cardiopulmonary process seen. Electronically Signed   By: Roanna Raider M.D.   On: 12/18/2017 01:50    Procedures Procedures (including critical care time)  Medications Ordered in ED Medications  naproxen (NAPROSYN) tablet 500 mg (500 mg Oral Given 12/18/17 0326)  ondansetron (ZOFRAN-ODT) disintegrating tablet 4 mg (4 mg Oral Given 12/18/17 0326)     Initial Impression / Assessment and Plan / ED Course  I have reviewed the triage vital signs and the nursing notes.  Pertinent labs & imaging results that were available  during my care of the patient were reviewed by me and considered in my medical decision making (see chart for details).     Patient presents with chest pain.  Acute in onset.  Overall nontoxic appearing and vital signs are reassuring.  Chest pain is reproducible on exam.  She is low risk for ACS.  EKG and troponin are reassuring.  Chest x-ray shows no evidence of pneumothorax or pneumonia.  She is PERC negative.  Suspect chest wall pain.  After history, exam, and medical workup I feel the patient has been appropriately medically screened and is safe for discharge home. Pertinent diagnoses were discussed with the patient. Patient was given return precautions.   Final Clinical Impressions(s) / ED Diagnoses   Final diagnoses:  Atypical  chest pain    ED Discharge Orders        Ordered    naproxen (NAPROSYN) 500 MG tablet  2 times daily     12/18/17 0428       Cage Gupton, Mayer Maskerourtney F, MD 12/18/17 [phone]

## 2017-12-18 NOTE — ED Triage Notes (Signed)
Reports having sore throat and chest discomfort that started an hour pta.  Also c/o nausea but has not vomited.

## 2017-12-18 NOTE — Discharge Instructions (Signed)
Were seen today for chest pain.  Your workup is reassuring.  This may be related to strained muscles in her chest.  Follow-up with your primary physician.

## 2017-12-29 ENCOUNTER — Emergency Department (HOSPITAL_COMMUNITY)
Admission: EM | Admit: 2017-12-29 | Discharge: 2017-12-29 | Disposition: A | Discharge status: Home / Self Care | Payer: BLUE CROSS/BLUE SHIELD | Attending: Emergency Medicine | Admitting: Emergency Medicine

## 2017-12-29 ENCOUNTER — Other Ambulatory Visit: Payer: Self-pay

## 2017-12-29 ENCOUNTER — Encounter (HOSPITAL_COMMUNITY): Payer: Self-pay | Admitting: Emergency Medicine

## 2017-12-29 ENCOUNTER — Emergency Department (HOSPITAL_COMMUNITY): Payer: BLUE CROSS/BLUE SHIELD

## 2017-12-29 DIAGNOSIS — Z041 Encounter for examination and observation following transport accident: Secondary | ICD-10-CM | POA: Diagnosis present

## 2017-12-29 DIAGNOSIS — M25562 Pain in left knee: Secondary | ICD-10-CM | POA: Insufficient documentation

## 2017-12-29 DIAGNOSIS — S161XXA Strain of muscle, fascia and tendon at neck level, initial encounter: Secondary | ICD-10-CM | POA: Diagnosis not present

## 2017-12-29 DIAGNOSIS — R0789 Other chest pain: Secondary | ICD-10-CM | POA: Insufficient documentation

## 2017-12-29 DIAGNOSIS — Y999 Unspecified external cause status: Secondary | ICD-10-CM | POA: Insufficient documentation

## 2017-12-29 DIAGNOSIS — Y929 Unspecified place or not applicable: Secondary | ICD-10-CM | POA: Diagnosis not present

## 2017-12-29 DIAGNOSIS — Y939 Activity, unspecified: Secondary | ICD-10-CM | POA: Insufficient documentation

## 2017-12-29 MED ORDER — IBUPROFEN 600 MG PO TABS: 600.0000 mg | ORAL_TABLET | Freq: Four times a day (QID) | ORAL | 0 refills | Status: AC | PRN

## 2017-12-29 MED ORDER — IBUPROFEN 800 MG PO TABS
800.0000 mg | ORAL_TABLET | Freq: Once | ORAL | Status: AC
  Administered 2017-12-29: 800 mg via ORAL
  Filled 2017-12-29: qty 1

## 2017-12-29 MED ORDER — CYCLOBENZAPRINE HCL 10 MG PO TABS: 10.0000 mg | ORAL_TABLET | Freq: Two times a day (BID) | ORAL | 0 refills | Status: AC | PRN
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
## 2020-11-10 ENCOUNTER — Other Ambulatory Visit: Payer: Self-pay

## 2020-11-10 DIAGNOSIS — Z20822 Contact with and (suspected) exposure to covid-19: Secondary | ICD-10-CM

## 2020-11-12 LAB — SARS-COV-2, NAA 2 DAY TAT

## 2020-11-12 LAB — NOVEL CORONAVIRUS, NAA: SARS-CoV-2, NAA: DETECTED — AB

## 2022-06-16 ENCOUNTER — Ambulatory Visit
Admit: 2022-06-16 | Discharge: 2022-06-16 | Payer: BLUE CROSS/BLUE SHIELD | Attending: Family Medicine | Primary: Family Medicine

## 2022-06-16 DIAGNOSIS — J4520 Mild intermittent asthma, uncomplicated: Secondary | ICD-10-CM

## 2022-06-16 NOTE — Assessment & Plan Note (Signed)
stable without medication

## 2022-06-16 NOTE — Progress Notes (Unsigned)
Date of Service: 06/16/2022  Patient: Christina Mayo  DOB: 12/30/1980    No chief complaint on file.        History of Present Illness:  Christina Mayo a 41 y.o. female presents today to establish care.  Pt presents for evaluation of the following issues:    HPI    Prior to Visit Medications    Not on File         Not on File  There is no problem list on file for this patient.    No past medical history on file.  No past surgical history on file.  No family history on file.  Social History     Tobacco Use    Smoking status: Not on file    Smokeless tobacco: Not on file   Substance Use Topics    Alcohol use: Not on file     Social History     Social History Narrative    Not on file         ROS:    Review of Systems      VITALS:    There were no vitals filed for this visit.   There is no height or weight on file to calculate BMI.      PHYSICAL EXAM:    Physical Exam       PREVIOUS RESULTS:    No results found for any previous visit.            No results found for this visit on 06/16/22.  No results found for: HBA1CPOC  No results found for: FLUA, FLUB  No results found for: UACOLORPOC, UACLARPOC, GLUCURPOC, BILIURPOC, SPECGRURPOC, UABLDPOC, PHURPOC, UAPROTPOC, UROBILPOC, NITRURPOC, LEUKOURPOC       ASSESSMENT and PLAN:    {There are no diagnoses linked to this encounter. (Refresh or delete this SmartLink)}    No follow-ups on file.      Gwyneth Sprout, DO     This dictation was generated by voice recognition computer software.  Although all attempts are made to edit the dictation for accuracy, there may be errors in the transcription that are not intended.     {Time Documentation Optional:[id number]}

## 2022-06-17 LAB — LIPID PANEL
Chol/HDL Ratio: 4.2 (ref 0.0–4.4)
Cholesterol: 158 mg/dL (ref 100–200)
HDL: 38 mg/dL — ABNORMAL LOW (ref >=50)
LDL Cholesterol: 103.6 mg/dL — ABNORMAL HIGH (ref 0.0–100.0)
LDL/HDL Ratio: 2.7
Triglycerides: 82 mg/dL (ref 0–149)
VLDL: 16.4 mg/dL (ref 5.0–40.0)

## 2022-06-17 LAB — TSH WITH REFLEX: TSH: 0.943 mcIU/mL (ref 0.358–3.740)

## 2022-06-17 LAB — CBC WITH AUTO DIFFERENTIAL
Absolute Baso #: 0 10*3/uL (ref 0.0–0.2)
Absolute Eos #: 0.4 10*3/uL (ref 0.0–0.5)
Absolute Lymph #: 2.4 10*3/uL (ref 1.0–3.2)
Absolute Mono #: 0.4 10*3/uL (ref 0.3–1.0)
Basophils %: 0.3 % (ref 0.0–2.0)
Eosinophils %: 6.1 % (ref 0.0–7.0)
Hematocrit: 35.8 % (ref 34.0–47.0)
Hemoglobin: 11.2 g/dL — ABNORMAL LOW (ref 11.5–15.7)
Immature Grans (Abs): 0.01 10*3/uL (ref 0.00–0.06)
Immature Granulocytes: 0.2 % (ref 0.0–0.6)
Lymphocytes: 42.1 % (ref 15.0–45.0)
MCH: 25.5 pg — ABNORMAL LOW (ref 27.0–34.5)
MCHC: 31.3 g/dL — ABNORMAL LOW (ref 32.0–36.0)
MCV: 81.5 fL (ref 81.0–99.0)
MPV: 11.5 fL (ref 7.2–13.2)
Monocytes: 6.1 % (ref 4.0–12.0)
NRBC Absolute: 0 10*3/uL (ref 0.000–0.012)
NRBC Automated: 0 % (ref 0.0–0.2)
Neutrophils %: 45.2 % (ref 42.0–74.0)
Neutrophils Absolute: 2.6 10*3/uL (ref 1.6–7.3)
Platelets: 241 10*3/uL (ref 140–440)
RBC: 4.39 x10e6/mcL (ref 3.60–5.20)
RDW: 12.6 % (ref 11.0–16.0)
WBC: 5.8 10*3/uL (ref 3.8–10.6)

## 2022-06-17 LAB — COMPREHENSIVE METABOLIC PANEL
ALT: 25 U/L (ref 0–35)
AST: 20 U/L (ref 0–35)
Albumin/Globulin Ratio: 1.2 (ref 1.00–2.70)
Albumin: 4.1 g/dL (ref 3.5–5.2)
Alk Phosphatase: 75 U/L (ref 35–117)
Anion Gap: 10 mmol/L (ref 2–17)
BUN: 7 mg/dL (ref 6–20)
CO2: 24 mmol/L (ref 22–29)
Calcium: 9 mg/dL (ref 8.6–10.0)
Chloride: 103 mmol/L (ref 98–107)
Creatinine: 0.6 mg/dL (ref 0.5–1.0)
Est, Glom Filt Rate: 117 mL/min/1.73m (ref >=60)
Globulin: 3.5 g/dL (ref 1.9–4.4)
Glucose: 106 mg/dL — ABNORMAL HIGH (ref 70–99)
OSMOLALITY CALCULATED: 272 mOsm/kg (ref 270–287)
Potassium: 4 mmol/L (ref 3.5–5.3)
Sodium: 137 mmol/L (ref 135–145)
Total Bilirubin: 0.36 mg/dL (ref 0.00–1.20)
Total Protein: 7.6 g/dL (ref 6.4–8.3)

## 2024-05-02 ENCOUNTER — Ambulatory Visit: Admit: 2024-05-02 | Discharge: 2024-05-02 | Payer: BLUE CROSS/BLUE SHIELD | Primary: Family Medicine

## 2024-05-02 ENCOUNTER — Ambulatory Visit: Admit: 2024-05-02 | Payer: BLUE CROSS/BLUE SHIELD | Primary: Family Medicine

## 2024-05-02 ENCOUNTER — Ambulatory Visit
Admit: 2024-05-02 | Discharge: 2024-05-02 | Payer: BLUE CROSS/BLUE SHIELD | Attending: Physician Assistant | Primary: Family Medicine

## 2024-05-02 VITALS — BP 160/92 | HR 65 | Temp 98.80000°F | Resp 17 | Ht 65.0 in | Wt 187.0 lb

## 2024-05-02 DIAGNOSIS — M79642 Pain in left hand: Secondary | ICD-10-CM

## 2024-05-02 DIAGNOSIS — M65949 Unspecified synovitis and tenosynovitis, unspecified hand: Secondary | ICD-10-CM

## 2024-05-02 MED ORDER — METHYLPREDNISOLONE 4 MG PO TBPK
4 | PACK | ORAL | 0 refills | 6.00000 days | Status: DC
Start: 2024-05-02 — End: 2024-10-17

## 2024-05-02 NOTE — Progress Notes (Signed)
 Subjective:      Patient ID: Christina Mayo       Edema   The patient is a 43 y.o. female here for left palmar pain and swelling that started yesterday.  States noticed yesterday morning her palm was tender with a round area of pink discoloration at central palm reporting unsure of cause.  Denies insect bite ,burn, injury, overuse.   Social: From [place].  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 No pertinent surgical history.     No Known Allergies     Current Outpatient Medications   Medication Sig Dispense Refill    methylPREDNISolone (MEDROL DOSEPACK) 4 MG tablet Follow instructions on package. Take by mouth for 6 days. 1 kit 0     No current facility-administered medications for this visit.        BP (!) 160/92 (BP Site: Left Upper Arm, Patient Position: Sitting, BP Cuff Size: Large Adult)   Pulse 65   Temp 98.8 F (37.1 C) (Oral)   Resp 17   Ht 1.651 m (5' 5)   Wt 84.8 kg (187 lb)   LMP 04/25/2024 (Approximate)   SpO2 100%   BMI 31.12 kg/m      Objective:   Physical Exam  Constitutional:       General: She is not in acute distress.     Appearance: Normal appearance. She is not ill-appearing, toxic-appearing or diaphoretic.   Eyes:      Conjunctiva/sclera: Conjunctivae normal.   Pulmonary:      Effort: Pulmonary effort is normal.   Musculoskeletal:      Cervical back: Neck supple.      Comments: Left  hand-mild edema of dorsal and palmar aspect.  No calor, discoloration.  TTP with edema and thick ropey texture over flexor tendon sheath .   NVI.  Reduced grip strength   Neurological:      Mental Status: She is alert and oriented to person, place, and time.   Psychiatric:         Mood and Affect: Mood normal.           Assessment:   1. Tenosynovitis of hand  -     methylPREDNISolone (MEDROL DOSEPACK) 4 MG tablet; Follow instructions on  package. Take by mouth for 6 days., Disp-1 kit, R-0Normal  -     [facility] - Orene Elsie SAUNDERS MD, [facility]  2. Elevated blood pressure reading      Plan:   Medrol Dosepak as directed  OTC Tylenol, not to exceed 3000 mg in a 24-hour period  Hand rest, cool compress vs moist heat compress  Urgent referral to  [facility] hand team - placed.  Appointment today at 3 PM    For elevated blood pressure, schedule appointment with PCP    Verneita Lindau, MMS, PA-C    (Please note that portions of this note were completed with a voice recognition program.  Efforts were made to edit the dictations but occasionally words are mis-transcribed.)     (Note to patient: The 21st Century Cures Act requires that medical notes like this be available to patients in the interest of transparency. However, be advised this is a medical document. It is intended as  peer to peer communication. It is written in medical language and may contain abbreviations or verbiage that are unfamiliar. It may appear blunt or direct. Medical documents are intended to carry relevant information, facts as evident, and the clinical opinion of the practitioner.)

## 2024-05-02 NOTE — Progress Notes (Signed)
 ORTHOPAEDIC SURGERY CLINIC NOTE    Chief Complaint   Patient presents with    New Patient     Left hand swelling that started yesterday morning, medrol dose pack was prescribed.  Advil prn.  Swelling is present, hardness in tissues.        History of Present Illness:  Patient is a right-hand-dominant female who presents today for evaluation and treatment of left hand swelling.  She states that on 04/27/2024 she woke up and noticed pain and swelling in her left palm.  She denies any injury or increase/change in activity.  She states that over the past day it has progressively gotten a little worse in terms of swelling and pain.  She went to an urgent care today, was prescribed a Medrol Dosepak, and instructed to come to orthopedics for follow-up.  She notes that most of her pain and swelling is localized over the palmar aspect of her fourth finger--this has not spread.  She does have swelling in the remainder of the fingers but notes that these move more freely with less pain.  She is unable to fully flex or extend her ring finger.  She denies fever, chills, nausea, or vomiting.  She denies any known insect bites or personal history of gout.      Vitals:  There were no vitals filed for this visit.  There is no height or weight on file to calculate BMI.      Review of Systems:  Pertinent ROS information listed in HPI        Physical Examination:  On exam today patient is pleasant, well groomed, and A&Ox3.    RIGHT HAND:  Inspection: No erythema, edema, or deformity  Palpation: Non tender  ROM: Full and painless in all planes  Stability: No instability appreciated  Strength: Intact  Sensation: Sensation intact  Tests:      RIGHT WRIST:  Inspection: No erythema, edema, or deformity  Palpation: Non tender   ROM: Full and painless in all planes   Stability: No instability appreciated  Strength: Strength intact  Tests:     LEFT HAND:  Inspection: Moderate edema through palm and fingers of left hand. No significant  erythema. No skin abrasions or visible insect bites.   Palpation: Edema and tenderness to palpation along the volar 4th ray. Tenderness along the course of the ring flexor tendon and some tenderness/edema adjacent to this. No tenderness in the remainder of the fingers or MCPs.   ROM: Flexion of the ring finger limited due to pain and swelling. Extension also limited due to increased pain in the ring finger. Remainder of finger ROM intact and painless.   Stability: No instability appreciated  Strength: Intact  Sensation: Sensation intact  Tests:      LEFT  WRIST:  Inspection: No erythema, edema, or deformity  Palpation: Non tender   ROM: Full and painless in all planes   Stability: No instability appreciated  Strength: Strength intact  Tests:       Imaging:   XR HAND LEFT (MIN 3 VIEWS) (CLINIC PERFORMED)  3 views of the left hand ordered and interpreted in clinic today show   normal-appearing joint spaces.  No acute bony abnormality or fracture.  No   soft tissue deformity.  Normal bone density.           Assessment & Plan:     Diagnosis Orders   1. Left hand pain  XR HAND LEFT (MIN 3 VIEWS) (CLINIC PERFORMED)  Patient has acute swelling along the course of her 4th flexor tendon. No evidence of injury. No significant signs of infection today.   Patient was prescribed a medrol dose pak earlier at urgent care but has not picked it up yet today. She is going to do that and start the medication this afternoon.  Patient cautioned to look out for: increased pain, increased swelling, spreading swelling/erythema, fever/chills/nausea/vomiting. If she experiences any of these symptoms she is instructed to go to the ER for further evaluation. She understands.  Follow-up next week for repeat evaluation. May consider MRI at that time if persistent pain and swelling evident.    Orders Placed This Encounter    XR HAND LEFT (MIN 3 VIEWS) (CLINIC PERFORMED)     Room 3        No follow-ups on file.     Medications:    Current  Outpatient Medications:     methylPREDNISolone (MEDROL DOSEPACK) 4 MG tablet, Follow instructions on package. Take by mouth for 6 days., Disp: 1 kit, Rfl: 0        This documentation was created using voice recognition software. There may be inaccuracies of transcription  that are inadvertently overlooked prior to the signature.  If there are any questions about the transcription please contact me.     Tinnie CHRISTELLA Lauber, PA-C  Physician Assistant  Orthopaedic Surgery    Electronically signed by Tinnie CHRISTELLA Lauber, PA-C on 05/02/2024 at 4:31 PM

## 2024-05-09 ENCOUNTER — Encounter: Payer: BLUE CROSS/BLUE SHIELD | Attending: Physician Assistant | Primary: Family Medicine

## 2024-05-09 DIAGNOSIS — M79642 Pain in left hand: Principal | ICD-10-CM

## 2024-05-09 NOTE — Progress Notes (Unsigned)
 ORTHOPAEDIC SURGERY CLINIC NOTE    No chief complaint on file.      History of Present Illness:  05/02/24: Patient is a right-hand-dominant female who presents today for evaluation and treatment of left hand swelling.  She states that on 04/27/2024 she woke up and noticed pain and swelling in her left palm.  She denies any injury or increase/change in activity.  She states that over the past day it has progressively gotten a little worse in terms of swelling and pain.  She went to an urgent care today, was prescribed a Medrol  Dosepak, and instructed to come to orthopedics for follow-up.  She notes that most of her pain and swelling is localized over the palmar aspect of her fourth finger--this has not spread.  She does have swelling in the remainder of the fingers but notes that these move more freely with less pain.  She is unable to fully flex or extend her ring finger.  She denies fever, chills, nausea, or vomiting.  She denies any known insect bites or personal history of gout.    05/09/24:      Vitals:  There were no vitals filed for this visit.  There is no height or weight on file to calculate BMI.      Review of Systems:  Pertinent ROS information listed in HPI        Physical Examination:  On exam today patient is pleasant, well groomed, and A&Ox3.    RIGHT HAND:  Inspection: No erythema, edema, or deformity  Palpation: Non tender  ROM: Full and painless in all planes  Stability: No instability appreciated  Strength: Intact  Sensation: Sensation intact  Tests:      RIGHT WRIST:  Inspection: No erythema, edema, or deformity  Palpation: Non tender   ROM: Full and painless in all planes   Stability: No instability appreciated  Strength: Strength intact  Tests:     LEFT HAND:  Inspection: Moderate edema through palm and fingers of left hand. No significant erythema. No skin abrasions or visible insect bites.   Palpation: Edema and tenderness to palpation along the volar 4th ray. Tenderness along the course of  the ring flexor tendon and some tenderness/edema adjacent to this. No tenderness in the remainder of the fingers or MCPs.   ROM: Flexion of the ring finger limited due to pain and swelling. Extension also limited due to increased pain in the ring finger. Remainder of finger ROM intact and painless.   Stability: No instability appreciated  Strength: Intact  Sensation: Sensation intact  Tests:      LEFT  WRIST:  Inspection: No erythema, edema, or deformity  Palpation: Non tender   ROM: Full and painless in all planes   Stability: No instability appreciated  Strength: Strength intact  Tests:       Imaging:   XR HAND LEFT (MIN 3 VIEWS) (CLINIC PERFORMED)  3 views of the left hand ordered and interpreted in clinic today show   normal-appearing joint spaces.  No acute bony abnormality or fracture.  No   soft tissue deformity.  Normal bone density.           Assessment & Plan:    {No diagnosis found. (Refresh or delete this SmartLink)}    Patient has acute swelling along the course of her 4th flexor tendon. No evidence of injury. No significant signs of infection today.   Patient was prescribed a medrol  dose pak earlier at urgent care but has not picked  it up yet today. She is going to do that and start the medication this afternoon.  Patient cautioned to look out for: increased pain, increased swelling, spreading swelling/erythema, fever/chills/nausea/vomiting. If she experiences any of these symptoms she is instructed to go to the ER for further evaluation. She understands.  Follow-up next week for repeat evaluation. May consider MRI at that time if persistent pain and swelling evident.    No orders of the defined types were placed in this encounter.       No follow-ups on file.     Medications:    Current Outpatient Medications:     methylPREDNISolone  (MEDROL  DOSEPACK) 4 MG tablet, Follow instructions on package. Take by mouth for 6 days., Disp: 1 kit, Rfl: 0        This documentation was created using voice recognition  software. There may be inaccuracies of transcription  that are inadvertently overlooked prior to the signature.  If there are any questions about the transcription please contact me.     Tinnie CHRISTELLA Lauber, PA-C  Physician Assistant  Orthopaedic Surgery    Electronically signed by Tinnie CHRISTELLA Lauber, PA-C on 05/03/2024 at 2:27 PM

## 2024-10-17 ENCOUNTER — Ambulatory Visit: Admit: 2024-10-17 | Discharge: 2024-10-17 | Payer: BLUE CROSS/BLUE SHIELD | Primary: Family Medicine

## 2024-10-17 DIAGNOSIS — T7840XA Allergy, unspecified, initial encounter: Principal | ICD-10-CM

## 2024-10-17 MED ORDER — FAMOTIDINE 20 MG PO TABS
20 | Freq: Once | ORAL | Status: AC
Start: 2024-10-17 — End: 2024-10-17
  Administered 2024-10-17: 23:00:00 40 mg via ORAL

## 2024-10-17 MED ORDER — DIPHENHYDRAMINE HCL 50 MG/ML IJ SOLN
50 | Freq: Once | INTRAMUSCULAR | Status: AC
Start: 2024-10-17 — End: 2024-10-17
  Administered 2024-10-17: 23:00:00 25 mg via INTRAMUSCULAR

## 2024-10-17 MED ORDER — METHYLPREDNISOLONE SODIUM SUCC 125 MG IJ SOLR
125 | Freq: Once | INTRAMUSCULAR | Status: AC
Start: 2024-10-17 — End: 2024-10-17
  Administered 2024-10-17: 23:00:00 125 mg via INTRAMUSCULAR

## 2024-10-17 MED ORDER — PREDNISONE 20 MG PO TABS
20 | ORAL_TABLET | Freq: Every day | ORAL | 0 refills | Status: AC
Start: 2024-10-17 — End: 2024-10-22

## 2024-10-17 NOTE — Patient Instructions (Signed)
"  Start Zyrtec (certirizine) 10 mg daily.  Start Pepcid  (famotidine ) 20 mg two times daily  Start Steroids 2 pills with breakfast daily.    Follow up with asthma and allergy as soon as possible.   "

## 2024-10-17 NOTE — Progress Notes (Signed)
 "CHIEF COMPLAINT:  Chief Complaint   Patient presents with    Edema    Rash     Facial swelling        HISTORY OF PRESENT ILLNESS:  43 yo female presents with facial and throat swelling.  Sx onset earlier today.  Other sx include pruritus, wheezing and SOB. Denies fv, chills, rash, new detergents, foods, or animals.  Similar sx in past starting over 2 months ago.  She has not been evaluated except through urgent care.  She has been using Zyrtec and Benadryl  for sx.     CURRENT MEDICATION LIST:    Current Outpatient Medications   Medication Sig Dispense Refill    predniSONE  (DELTASONE ) 20 MG tablet Take 2 tablets by mouth daily for 5 days 10 tablet 0     No current facility-administered medications for this visit.        ALLERGIES:    No Known Allergies     HISTORY:  Past Medical History:   Diagnosis Date    Asthma       History reviewed. No pertinent surgical history.   Social History     Socioeconomic History    Marital status: Married     Spouse name: Not on file    Number of children: Not on file    Years of education: Not on file    Highest education level: Not on file   Occupational History    Not on file   Tobacco Use    Smoking status: Never    Smokeless tobacco: Never   Vaping Use    Vaping status: Never Used   Substance and Sexual Activity    Alcohol use: Not Currently    Drug use: Not Currently    Sexual activity: Yes     Partners: Male   Other Topics Concern    Not on file   Social History Narrative    Not on file     Social Drivers of Health     Financial Resource Strain: Not on file   Food Insecurity: Not on file   Transportation Needs: Not on file   Physical Activity: Not on file   Stress: Not on file   Social Connections: Not on file   Intimate Partner Violence: Not on file   Housing Stability: Not on file      History reviewed. No pertinent family history.     REVIEW OF SYSTEMS:  Review of Systems   PER HPI    PHYSICAL EXAM:  Physical Exam  Vitals and nursing note reviewed.   Constitutional:        General: She is not in acute distress.     Appearance: Normal appearance. She is not ill-appearing, toxic-appearing or diaphoretic.   HENT:      Head: Normocephalic and atraumatic.      Nose: Nose normal.      Mouth/Throat:      Mouth: Mucous membranes are moist.      Pharynx: Uvula midline. Posterior oropharyngeal erythema and postnasal drip present.      Comments: Facial swelling and upper Lip swelling  Cardiovascular:      Rate and Rhythm: Normal rate and regular rhythm.      Heart sounds: Normal heart sounds.   Pulmonary:      Effort: Pulmonary effort is normal.      Breath sounds: Normal breath sounds.   Musculoskeletal:      Cervical back: Normal range of motion and neck supple. No  rigidity or tenderness.   Lymphadenopathy:      Cervical: No cervical adenopathy.   Skin:     General: Skin is warm and dry.   Neurological:      General: No focal deficit present.      Mental Status: She is alert and oriented to person, place, and time. Mental status is at baseline.   Psychiatric:         Mood and Affect: Mood normal.         Behavior: Behavior normal.         Judgment: Judgment normal.        Vital Signs -   Visit Vitals  BP (!) 142/86 (BP Site: Left Upper Arm, Patient Position: Sitting, BP Cuff Size: Large Adult)   Pulse 68   Temp 98.7 F (37.1 C) (Oral)   Resp 16   Ht 1.651 m (5' 5)   Wt 87.7 kg (193 lb 4.8 oz)   SpO2 100%   BMI 32.17 kg/m            LABS  No results found for this visit on 10/17/24.      TREATMENT:    1. Allergic reaction, initial encounter  -     methylPREDNISolone  sodium succ (SOLU-MEDROL ) injection 125 mg; 125 mg, IntraMUSCular, ONCE, 1 dose, On Wed 10/17/24 at 1800  -     diphenhydrAMINE  (BENADRYL ) injection 25 mg; 25 mg, IntraMUSCular, ONCE, 1 dose, On Wed 10/17/24 at 1800  -     famotidine  (PEPCID ) tablet 40 mg; 40 mg, Oral, ONCE, 1 dose, On Wed 10/17/24 at 1800  [facility]  -     predniSONE  (DELTASONE ) 20 MG tablet; Take 2 tablets by mouth daily for 5 days,  Disp-10 tablet, R-0Normal  2. Angioedema, initial encounter  -     methylPREDNISolone  sodium succ (SOLU-MEDROL ) injection 125 mg; 125 mg, IntraMUSCular, ONCE, 1 dose, On Wed 10/17/24 at 1800  -     diphenhydrAMINE  (BENADRYL ) injection 25 mg; 25 mg, IntraMUSCular, ONCE, 1 dose, On Wed 10/17/24 at 1800  -     famotidine  (PEPCID ) tablet 40 mg; 40 mg, Oral, ONCE, 1 dose, On Wed 10/17/24 at 1800  [facility]  -     predniSONE  (DELTASONE ) 20 MG tablet; Take 2 tablets by mouth daily for 5 days, Disp-10 tablet, R-0Normal       MDM/EDUCATION:   PE findings, test results, and treatment plan discussed with patient/parent/SO.  Overall PE reassuring.  Script sent to pharmacy. Urgent referral to ENT /allergy for further evaluation. BP improved and patient facial swelling improved.  RTC/ED precautions given.  Patient/parent/SO vu above and agrees with current plan.  Patient Instructions   Start Zyrtec (certirizine) 10 mg daily.  Start Pepcid  (famotidine ) 20 mg two times daily  Start Steroids 2 pills with breakfast daily.    Follow up with asthma and allergy as soon as possible.         Follow up and Dispositions:  Return if symptoms worsen or fail to improve.       Montie Anette Flesher, FNP    I have reviewed prior visit notes and lab results pertinent to this visit. Point of care results and imaging were independently interpreted by myself and discussed with the patient. Educated the patient on disease process, expected course of illness, and management. Educated the patient on how to administer prescribed medications and possible side  effects.  Patient instructed to follow-up immediately for any new or worsening symptoms.  Patient verbalized understanding and agreement with plan of care.    Please note that this note was generated using voice recognition Scientist, clinical (histocompatibility and immunogenetics).  Although every effort was made to ensure the accuracy of this automated transcription, some errors in transcription may have  occurred.     "
